# Patient Record
Sex: Male | Born: 1965 | Race: White | Hispanic: No | Marital: Married | Smoking: Former smoker
Health system: Southern US, Community
[De-identification: ages and names within clinical notes are randomized; demographics above are authoritative.]

## PROBLEM LIST (undated history)

## (undated) DIAGNOSIS — T7840XA Allergy, unspecified, initial encounter: Secondary | ICD-10-CM

## (undated) HISTORY — DX: Allergy, unspecified, initial encounter: T78.40XA

---

## 2004-05-24 ENCOUNTER — Encounter: Admission: RE | Admit: 2004-05-24 | Discharge: 2004-05-24 | Payer: Self-pay | Admitting: Otolaryngology

## 2006-02-21 IMAGING — CT CT ORBIT/TEMPORAL/IAC W/O CM
4 of 6 series · 12 of 30 positions shown, 13 images · IV contrast (agent unspecified)
Comparison: None.

CLINICAL DATA: Sensorineural hearing loss.  
PETROUS/TEMPORAL BONE CT WITHOUT CONTRAST:

[Series 2: coronal · axial · 0.33mm/px · z∈[+20,+40]mm · 3 of 62 slices shown, 4 images]
[im 16/62  brain]
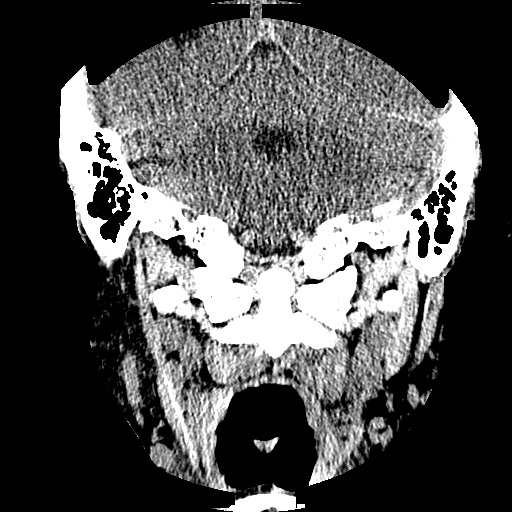
[im 16/62  bone]
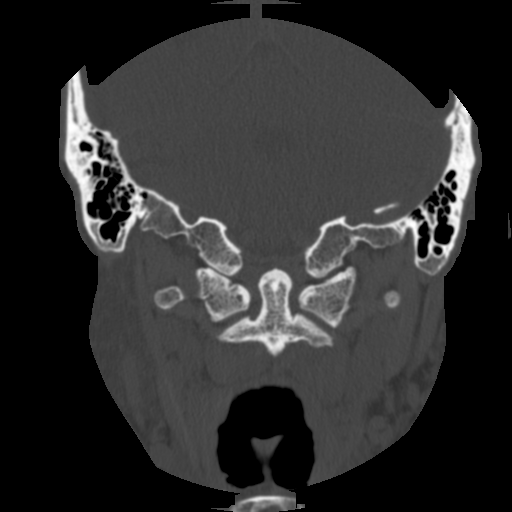
[im 31/62  bone]
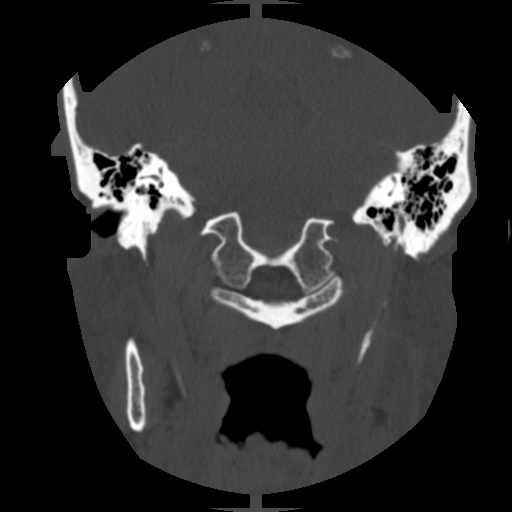
[im 46/62  bone]
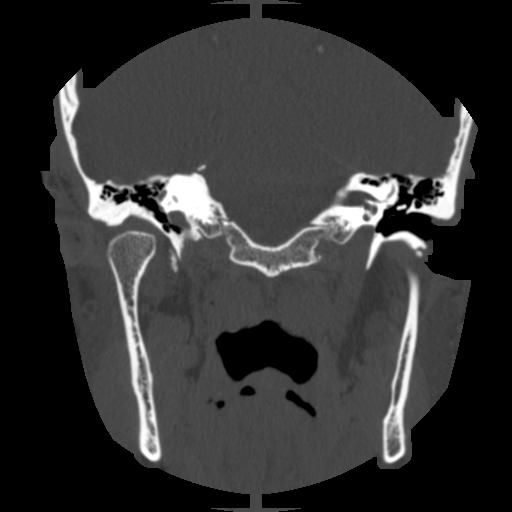

[Series 3: recon 2: coronal · axial · 0.19mm/px · z∈[+6,+26]mm · 3 of 62 slices shown]
[im 16/62  bone]
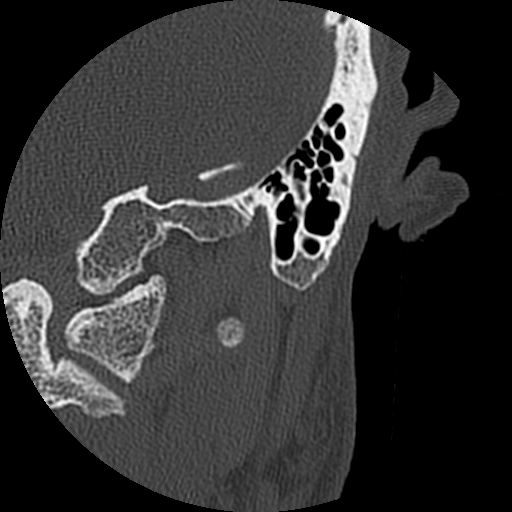
[im 31/62  bone]
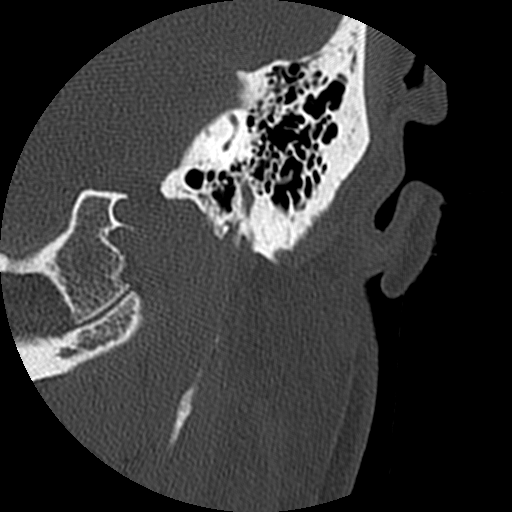
[im 46/62  bone]
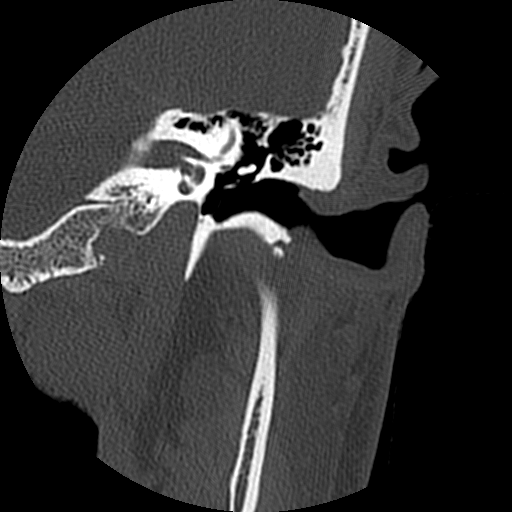

[Series 4: recon 3: coronal · axial · 0.19mm/px · z∈[+6,+26]mm · 3 of 62 slices shown]
[im 16/62  bone]
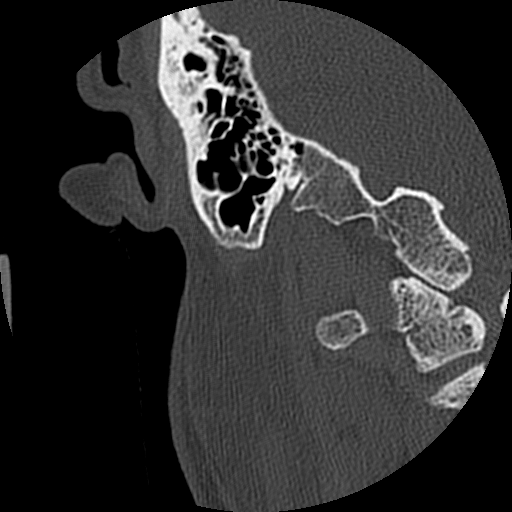
[im 31/62  bone]
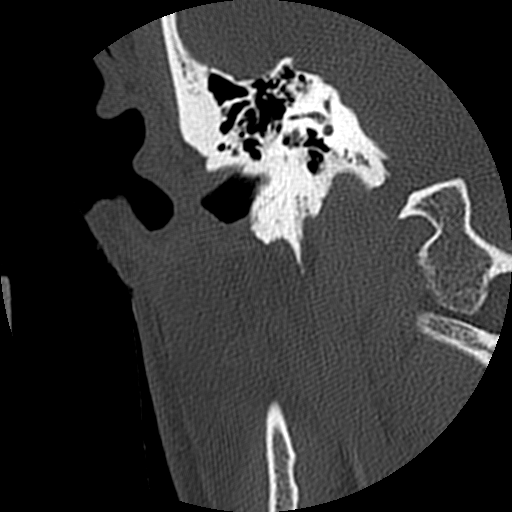
[im 46/62  bone]
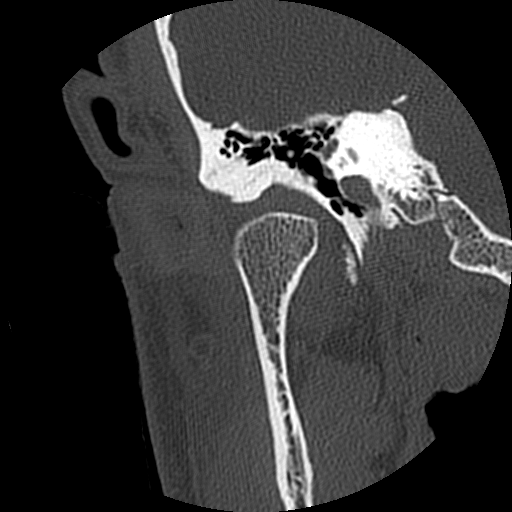

[Series 6: axial · axial · 0.33mm/px · z∈[-5,+14]mm · 3 of 62 slices shown]
[im 16/62  bone]
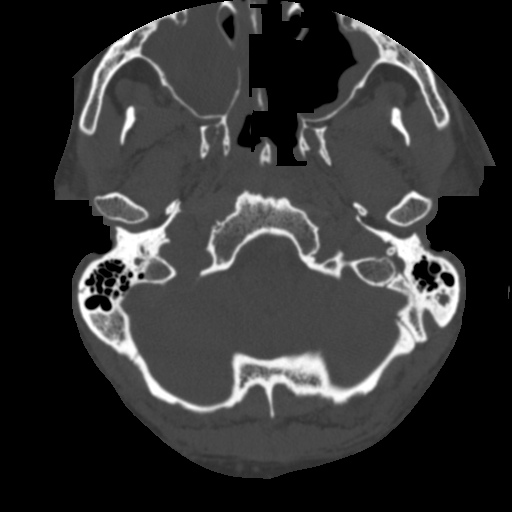
[im 31/62  bone]
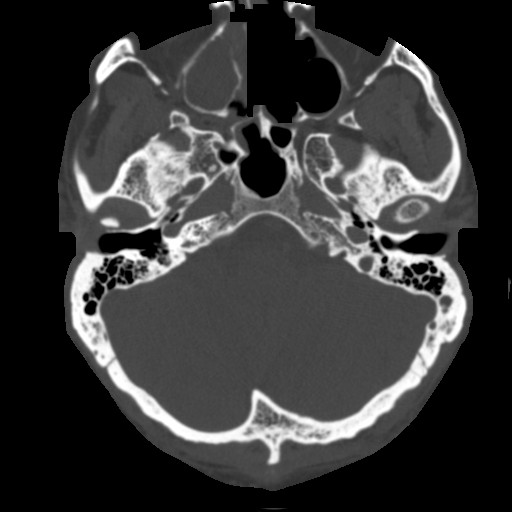
[im 46/62  bone]
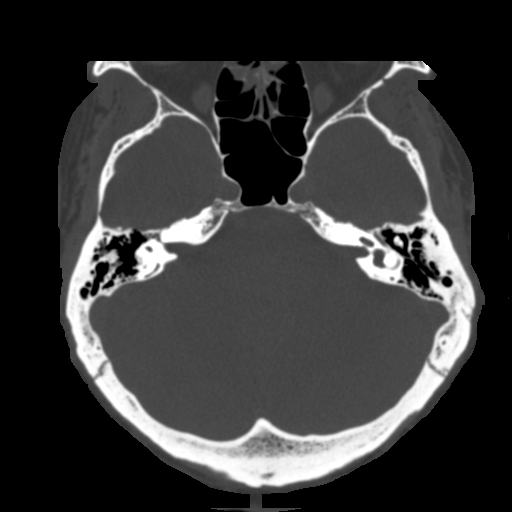

[12 of 30 positions shown; findings below may reference images not displayed]

FINDINGS: Complete opacification right maxillary sinus.  Moderate mucosal thickening left maxillary sinus.  Partial opacification ethmoid sinus air cells bilaterally.  The sphenoid sinus air cells appear clear.  Right mastoid air cells and middle ear are clear.  Left middle ear clear.  Partial opacification of posterior left mastoid air cells.  Some of the septae in the area of opacification may be partially destroyed (coalescence) or poorly delineated because of the thinness of the septum.  There is no evidence to suggest breakthrough into the region of the sigmoid plates nor lateral breakthrough into the adjacent soft tissue. 
The vestibular aqueduct does not appear enlarged.  Cochlea appears formed as do the semicircular canals.  No destruction of the ossicles.  Scutum is intact.   No opacification along Prussak?s space.  Mild dehiscence of the bony cover of the horizontal course of the seventh cranial nerve cannot be excluded given the thinness of the bone at this level (just below the lateral semicircular canal).  For further delineation of retrocochlear abnormality, MR imaging may be considered.  Minimal soft tissue prominence along the superior aspect of the external auditory canal left greater than right may be related to cerumen.
IMPRESSION: 1.  artial opacification posterior left mastoid air cells.  There may be destruction of the thin septum versus poor delineation of the septum Iin this region because of the opacification.  One could further evaluate this on followup imaging.  Please see above discussion. 
2.  A cause for sensorineural hearing loss is not identified on the present exam.  If retrocochlear abnormality is of concern then MR imaging may be considered for further delineation. 
3.  Paranasal sinus disease most notable maxillary sinuses followed by ethmoid sinus air cells.

## 2010-12-29 ENCOUNTER — Ambulatory Visit (INDEPENDENT_AMBULATORY_CARE_PROVIDER_SITE_OTHER): Payer: 59

## 2010-12-29 DIAGNOSIS — H6980 Other specified disorders of Eustachian tube, unspecified ear: Secondary | ICD-10-CM

## 2010-12-29 DIAGNOSIS — J069 Acute upper respiratory infection, unspecified: Secondary | ICD-10-CM

## 2012-01-05 ENCOUNTER — Ambulatory Visit (INDEPENDENT_AMBULATORY_CARE_PROVIDER_SITE_OTHER): Payer: 59 | Admitting: Emergency Medicine

## 2012-01-05 VITALS — BP 135/82 | HR 64 | Temp 97.9°F | Resp 16 | Ht 70.2 in | Wt 179.0 lb

## 2012-01-05 DIAGNOSIS — K920 Hematemesis: Secondary | ICD-10-CM

## 2012-01-05 DIAGNOSIS — R112 Nausea with vomiting, unspecified: Secondary | ICD-10-CM

## 2012-01-05 LAB — POCT CBC
Granulocyte percent: 75 %G (ref 37–80)
HCT, POC: 51.6 % — AB (ref 37.7–47.9)
Lymph, poc: 1.4 (ref 0.6–3.4)
MCH, POC: 28.3 pg (ref 27–31.2)
MCHC: 31 g/dL — AB (ref 31.8–35.4)
MCV: 91.2 fL (ref 80–97)
POC LYMPH PERCENT: 18.5 %L (ref 10–50)
Platelet Count, POC: 288 10*3/uL (ref 142–424)
WBC: 7.8 10*3/uL (ref 4.6–10.2)

## 2012-01-05 MED ORDER — ESOMEPRAZOLE MAGNESIUM 40 MG PO CPDR: 40.0000 mg | DELAYED_RELEASE_CAPSULE | Freq: Every day | ORAL | Status: DC

## 2012-01-05 MED ORDER — ONDANSETRON 8 MG PO TBDP: 8.0000 mg | ORAL_TABLET | Freq: Three times a day (TID) | ORAL | Status: DC | PRN

## 2012-01-05 NOTE — Progress Notes (Signed)
[facility] [address], [place] [address] [phone]  Date:  01/05/2012   Name:  Brian Yu   DOB:  1965-01-30   MRN:  [id number]  PCP:  Tally Due, MD    Chief Complaint: Emesis   History of Present Illness:  Brian Yu is a 46 y.o. very pleasant male patient who presents with the following:  Says that his wife and child were ill with GI virus and that yesterday he had several episodes of vomiting that were blood tinged.  Nauseated prior to vomiting and had nausea today in afternoon.  No food last night or today. No history of PUD or reflux or hiatal hernia.  No hematemesis or melena or hematochezia.  Occasional drinker and regularly drinks caffeinated beverages.  4-5 times a week takes NSAID for headache.    There is no problem list on file for this patient.   History reviewed. No pertinent past medical history.  History reviewed. No pertinent past surgical history.  History  Substance Use Topics  . Smoking status: Current Every Day Smoker  . Smokeless tobacco: Not on file  . Alcohol Use: Yes    History reviewed. No pertinent family history.  No Known Allergies  Medication list has been reviewed and updated.  No current outpatient prescriptions on file prior to visit.    Review of Systems:  As per HPI, otherwise negative.    Physical Examination: Filed Vitals:   01/05/12 1344  BP: 135/82  Pulse: 64  Temp: 97.9 F (36.6 C)  Resp: 16   Filed Vitals:   01/05/12 1344  Height: 5' 10.2" (1.783 m)  Weight: 179 lb (81.194 kg)   Body mass index is 25.54 kg/(m^2). Ideal Body Weight: Weight in (lb) to have BMI = 25: 174.9   GEN: WDWN, NAD, Non-toxic, A & O x 3 not icteric HEENT: Atraumatic, Normocephalic. Neck supple. No masses, No LAD. Ears and Nose: No external deformity. CV: RRR, No M/G/R. No JVD. No thrill. No extra heart sounds. PULM: CTA B, no wheezes, crackles, rhonchi. No retractions. No resp. distress.  No accessory muscle use. ABD: S, NT, ND, +BS. No rebound. No HSM. EXTR: No c/c/e NEURO Normal gait.  PSYCH: Normally interactive. Conversant. Not depressed or anxious appearing.  Calm demeanor.  Rectal:  Grossly negative stool  Assessment and Plan: Hematemesis Nausea and vomiting.   Avoid NSAID/ASA Labs GI consult for EGD  Carmelina Dane, MD  Results for orders placed in visit on 01/05/12  POCT CBC      Component Value Range   WBC 7.8  4.6 - 10.2 K/uL   Lymph, poc 1.4  0.6 - 3.4   POC LYMPH PERCENT 18.5  10 - 50 %L   MID (cbc) 0.5  0 - 0.9   POC MID % 6.5  0 - 12 %M   POC Granulocyte 5.9  2 - 6.9   Granulocyte percent 75.0  37 - 80 %G   RBC 5.66 (*) 4.04 - 5.48 M/uL   Hemoglobin 16.0  12.2 - 16.2 g/dL   HCT, POC 86.5 (*) 78.4 - 47.9 %   MCV 91.2  80 - 97 fL   MCH, POC 28.3  27 - 31.2 pg   MCHC 31.0 (*) 31.8 - 35.4 g/dL   RDW, POC 69.6     Platelet Count, POC 288  142 - 424 K/uL   MPV 8.0  0 - 99.8 fL  IFOBT (OCCULT BLOOD)  Component Value Range   IFOBT Negative

## 2012-01-05 NOTE — Patient Instructions (Addendum)
Hematemesis  This condition is the vomiting of blood.  CAUSES   This can happen if you have a peptic ulcer or an irritation of the throat, stomach, or small bowel. Vomiting over and over again or swallowing blood from a nosebleed, coughing or facial injury can also result in bloody vomit. Anti-inflammatory pain medicines are a common cause of this potentially dangerous condition. The most serious causes of vomiting blood include:   Ulcers (a bacteria called H. pylori is common cause of ulcers).   Clotting problems.   Alcoholism.   Cirrhosis.  TREATMENT   Treatment depends on the cause and the severity of the bleeding. Small amounts of blood streaks in the vomit is not the same as vomiting large amounts of bloody or dark, coffee grounds-like material. Weakness, fainting, dehydration, anemia, and continued alcohol or drug use increase the risk. Examination may include blood, vomit, or stool tests. The presence of bloody or dark stool that tests positive for blood (Hemoccult) means the bleeding has been going on for some time. Endoscopy and imaging studies may be done. Emergency treatment may include:   IV medicines or fluids.   Blood transfusions.   Surgery.  Hospital care is required for high risk patients or when IV fluids or blood is needed. Upper GI bleeding can cause shock and death if not controlled.  HOME CARE INSTRUCTIONS    Your treatment does not require hospital care at this time.   Remain at rest until your condition improves.   Drink clear liquids as tolerated.   Avoid:   Alcohol.   Nicotine.   Aspirin.   Any other anti-inflammatory medicine (ibuprofen, naproxen, and many others).   Medications to suppress stomach acid or vomiting may be needed. Take all your medicine as prescribed.   Be sure to see your caregiver for follow-up as recommended.  SEEK IMMEDIATE MEDICAL CARE IF:    You have repeated vomiting, dehydration, fainting, or extreme weakness.   You are vomiting large amounts of  bloody or dark material.   You pass large, dark or bloody stools.  Document Released: 02/02/2004 Document Revised: 03/19/2011 Document Reviewed: 02/18/2008  [employer] Patient Information 2013 [employer].

## 2012-01-06 NOTE — Progress Notes (Signed)
Reviewed and agree.

## 2012-01-07 ENCOUNTER — Other Ambulatory Visit: Payer: Self-pay | Admitting: *Deleted

## 2012-01-07 LAB — H. PYLORI ANTIBODY, IGG: H Pylori IgG: <0.4 {ISR}

## 2012-01-07 MED ORDER — ESOMEPRAZOLE MAGNESIUM 40 MG PO CPDR: 40.0000 mg | DELAYED_RELEASE_CAPSULE | Freq: Every day | ORAL | Status: DC

## 2012-01-07 NOTE — Addendum Note (Signed)
Addended by: Cydney Ok on: 01/07/2012 12:47 PM   Modules accepted: Orders

## 2012-01-08 ENCOUNTER — Telehealth: Payer: Self-pay

## 2012-01-08 NOTE — Telephone Encounter (Signed)
Pt states that [facility] [address] states that they never received his rx for nexium from [place]. Pt would like for you to contact the pharmacy asap. Best# [phone]

## 2012-01-08 NOTE — Telephone Encounter (Signed)
Was sent in ofr him yesteredy

## 2012-06-03 ENCOUNTER — Ambulatory Visit (INDEPENDENT_AMBULATORY_CARE_PROVIDER_SITE_OTHER): Payer: 59 | Admitting: Internal Medicine

## 2012-06-03 VITALS — BP 110/70 | HR 61 | Temp 98.0°F | Resp 16 | Ht 70.0 in | Wt 175.0 lb

## 2012-06-03 DIAGNOSIS — K1120 Sialoadenitis, unspecified: Secondary | ICD-10-CM

## 2012-06-03 MED ORDER — AMOXICILLIN 500 MG PO CAPS: 1000.0000 mg | ORAL_CAPSULE | Freq: Two times a day (BID) | ORAL | Status: DC

## 2012-06-03 NOTE — Progress Notes (Signed)
  Subjective:    Patient ID: Brian Yu, male    DOB: 03-21-1965, 47 y.o.   MRN: [id number]  HPI 3-4d of swelling of right submandibular gland, is a smoker. No pain but feels tense when swells after eating. No fever. Had recent right ear canal infection. Phx 2 rebuilt tms on the right. Dr Lovey Newcomer his ENT.   Review of Systems     Objective:   Physical Exam  Constitutional: She appears well-developed and well-nourished.  HENT:  Head: Normocephalic.  Right Ear: External ear normal.  Left Ear: External ear normal.  Nose: Nose normal.  Mouth/Throat: No oral lesions. No edematous. No oropharyngeal exudate, posterior oropharyngeal edema or posterior oropharyngeal erythema.    Inflamed at duct, whartons, posiible stone and purulence at opening   Swollen, smooth, fixed mildly tender right submandibular gland       Assessment & Plan:  Quit smoking plan discuseed Amoxil 1g bid See Dr. Lovey Newcomer 2 weeks

## 2012-06-03 NOTE — Patient Instructions (Addendum)
Salivary Stone  Your exam shows you have a stone in one of your saliva glands. These small stones form around a mucous plug in the ducts of the glands and cause the saliva in the gland to be blocked. This makes the gland swollen and painful, especially when you eat. If repeated episodes occur, the gland can become infected. Sometimes these stones can be seen on x-ray.  Treatment includes stimulating the production of saliva to push the stone out. You should suck on a lemon or sour candies several times daily. Antibiotic medicine may be needed if the gland is infected. Increasing fluids, applying warm compresses to the swollen area 3-4 times daily, and massaging the gland from back to front may encourage drainage and passage of the stone.  Surgical treatment to remove the stone is sometimes necessary, so proper medical follow up is very important. Call your doctor for an appointment as recommended. Call right away if you have a high fever, severe headache, vomiting, uncontrolled pain, or other serious symptoms.  Document Released: 02/02/2004 Document Revised: 03/19/2011 Document Reviewed: 12/25/2004  [employer] Patient Information 2014 [employer].

## 2016-01-09 HISTORY — PX: COLONOSCOPY: SHX174

## 2016-01-23 DIAGNOSIS — E559 Vitamin D deficiency, unspecified: Secondary | ICD-10-CM | POA: Diagnosis not present

## 2016-01-23 DIAGNOSIS — Z125 Encounter for screening for malignant neoplasm of prostate: Secondary | ICD-10-CM | POA: Diagnosis not present

## 2016-02-06 DIAGNOSIS — Z0000 Encounter for general adult medical examination without abnormal findings: Secondary | ICD-10-CM | POA: Diagnosis not present

## 2016-02-06 DIAGNOSIS — Z139 Encounter for screening, unspecified: Secondary | ICD-10-CM | POA: Diagnosis not present

## 2016-02-20 DIAGNOSIS — Z23 Encounter for immunization: Secondary | ICD-10-CM | POA: Diagnosis not present

## 2016-04-16 DIAGNOSIS — Z23 Encounter for immunization: Secondary | ICD-10-CM | POA: Diagnosis not present

## 2016-05-03 DIAGNOSIS — M549 Dorsalgia, unspecified: Secondary | ICD-10-CM | POA: Diagnosis not present

## 2016-05-07 DIAGNOSIS — E785 Hyperlipidemia, unspecified: Secondary | ICD-10-CM | POA: Diagnosis not present

## 2016-05-07 DIAGNOSIS — R7989 Other specified abnormal findings of blood chemistry: Secondary | ICD-10-CM | POA: Diagnosis not present

## 2016-05-09 DIAGNOSIS — E785 Hyperlipidemia, unspecified: Secondary | ICD-10-CM | POA: Diagnosis not present

## 2016-05-09 DIAGNOSIS — J309 Allergic rhinitis, unspecified: Secondary | ICD-10-CM | POA: Diagnosis not present

## 2016-05-09 DIAGNOSIS — M549 Dorsalgia, unspecified: Secondary | ICD-10-CM | POA: Diagnosis not present

## 2016-08-13 DIAGNOSIS — Z23 Encounter for immunization: Secondary | ICD-10-CM | POA: Diagnosis not present

## 2017-02-11 DIAGNOSIS — Z0000 Encounter for general adult medical examination without abnormal findings: Secondary | ICD-10-CM | POA: Diagnosis not present

## 2017-02-11 DIAGNOSIS — Z125 Encounter for screening for malignant neoplasm of prostate: Secondary | ICD-10-CM | POA: Diagnosis not present

## 2017-02-11 DIAGNOSIS — E559 Vitamin D deficiency, unspecified: Secondary | ICD-10-CM | POA: Diagnosis not present

## 2017-02-19 DIAGNOSIS — Z1211 Encounter for screening for malignant neoplasm of colon: Secondary | ICD-10-CM | POA: Diagnosis not present

## 2017-02-19 DIAGNOSIS — E785 Hyperlipidemia, unspecified: Secondary | ICD-10-CM | POA: Diagnosis not present

## 2017-02-19 DIAGNOSIS — Z0000 Encounter for general adult medical examination without abnormal findings: Secondary | ICD-10-CM | POA: Diagnosis not present

## 2017-02-19 DIAGNOSIS — R739 Hyperglycemia, unspecified: Secondary | ICD-10-CM | POA: Diagnosis not present

## 2017-02-19 DIAGNOSIS — D696 Thrombocytopenia, unspecified: Secondary | ICD-10-CM | POA: Diagnosis not present

## 2017-08-19 DIAGNOSIS — E785 Hyperlipidemia, unspecified: Secondary | ICD-10-CM | POA: Diagnosis not present

## 2017-08-21 DIAGNOSIS — J309 Allergic rhinitis, unspecified: Secondary | ICD-10-CM | POA: Diagnosis not present

## 2017-08-21 DIAGNOSIS — G43909 Migraine, unspecified, not intractable, without status migrainosus: Secondary | ICD-10-CM | POA: Diagnosis not present

## 2017-08-21 DIAGNOSIS — E782 Mixed hyperlipidemia: Secondary | ICD-10-CM | POA: Diagnosis not present

## 2017-10-07 ENCOUNTER — Ambulatory Visit (INDEPENDENT_AMBULATORY_CARE_PROVIDER_SITE_OTHER): Payer: 59

## 2017-10-07 ENCOUNTER — Ambulatory Visit (INDEPENDENT_AMBULATORY_CARE_PROVIDER_SITE_OTHER): Payer: 59 | Admitting: Orthopaedic Surgery

## 2017-10-07 ENCOUNTER — Encounter (INDEPENDENT_AMBULATORY_CARE_PROVIDER_SITE_OTHER): Payer: Self-pay | Admitting: Orthopaedic Surgery

## 2017-10-07 DIAGNOSIS — M25521 Pain in right elbow: Secondary | ICD-10-CM | POA: Diagnosis not present

## 2017-10-07 MED ORDER — DICLOFENAC SODIUM 1 % TD GEL: 2.0000 g | Freq: Four times a day (QID) | TRANSDERMAL | 3 refills | Status: DC

## 2017-10-07 MED ORDER — METHYLPREDNISOLONE ACETATE 40 MG/ML IJ SUSP
40.0000 mg | INTRAMUSCULAR | Status: AC | PRN
Start: 2017-10-07 — End: 2017-10-07
  Administered 2017-10-07: 40 mg

## 2017-10-07 MED ORDER — LIDOCAINE HCL 1 % IJ SOLN
1.0000 mL | INTRAMUSCULAR | Status: AC | PRN
Start: 2017-10-07 — End: 2017-10-07
  Administered 2017-10-07: 1 mL

## 2017-10-07 NOTE — Progress Notes (Signed)
Office Visit Note   Patient: Brian Yu           Date of Birth: April 09, 1965           MRN: [id number] Visit Date: 10/07/2017              Requested by: No referring provider defined for this encounter. PCP: Lewis Moccasin, MD   Assessment & Plan: Visit Diagnoses:  1. 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  We will try some Voltaren gel if his insurance company will cover this.  Also showed him stretching exercises to try.  He was interested in a steroid injection and I agree with trying this as well The risk minutes of injections.  He tolerated it in the right epicondyl area laterally of his right elbow.  All question concerns were answered and addressed.  Follow-up will be as needed.  He is going to work on avoiding repetitive activities as well.  Follow-Up Instructions: Return if symptoms worsen or fail to improve.   Orders:  Orders Placed This Encounter  Procedures  . XR Elbow 2 Views Right   Meds ordered this encounter  Medications  . diclofenac sodium (VOLTAREN) 1 % GEL    Sig: Apply 2 g topically 4 (four) times daily.    Dispense:  100 g    Refill:  3      Procedures: Hand/UE Inj for lateral epicondylitis on 10/07/2017 1:26 PM Medications: 1 mL lidocaine 1 %; 40 mg methylPREDNISolone acetate 40 MG/ML      Clinical Data: No additional findings.   Subjective: Chief Complaint  Patient presents with  . Right Elbow - Pain  Patient is a very pleasant 52 year old right-hand-dominant male who comes in with elbow pain for few weeks now.  He points to the lateral epicondyle area as a source of his pain.  He denies any significant repetitive activities but he has been sitting with his arm up on the edge of the couch and using a mouse for his computer and that may have led to this.  He denies any shoulder pain he denies any numbness and tingling in his right  hand.  HPI  Review of Systems He currently denies any headache, chest pain, shortness of breath, fever, chills, nausea, vomiting.  Objective: Vital Signs: There were no vitals taken for this visit.  Physical Exam He is alert and oriented x3 and in no acute distress Ortho Exam Examination of his right elbow shows a normal elbow in terms of range of motion and stability on ligamentous exam.  He has no pain over the medial aspect of his elbow is negative Tinel sign over the cubital tunnel.  He has significant pain over the lateral epicondyle area of the elbow.  The remainder of his right upper extremity exam is normal. Specialty Comments:  No specialty comments available.  Imaging: Xr Elbow 2 Views Right  Result Date: 10/07/2017 2 views of the right elbow are unremarkable.  There are no acute findings.  The joint space is well-maintained.    PMFS History: There are no active problems to display for this patient.  Past Medical History:  Diagnosis Date  . Allergy     History reviewed. No pertinent family history.  History reviewed. No pertinent surgical history. Social History   Occupational History  . Not on file  Tobacco Use  .  Smoking status: Current Every Day Smoker  Substance and Sexual Activity  . Alcohol use: Yes  . Drug use: No  . Sexual activity: Not on file

## 2017-10-14 ENCOUNTER — Ambulatory Visit (INDEPENDENT_AMBULATORY_CARE_PROVIDER_SITE_OTHER): Payer: Self-pay | Admitting: Orthopaedic Surgery

## 2017-10-22 DIAGNOSIS — Z13220 Encounter for screening for lipoid disorders: Secondary | ICD-10-CM | POA: Diagnosis not present

## 2017-10-22 DIAGNOSIS — E782 Mixed hyperlipidemia: Secondary | ICD-10-CM | POA: Diagnosis not present

## 2017-10-22 DIAGNOSIS — Z0000 Encounter for general adult medical examination without abnormal findings: Secondary | ICD-10-CM | POA: Diagnosis not present

## 2017-10-25 DIAGNOSIS — E782 Mixed hyperlipidemia: Secondary | ICD-10-CM | POA: Diagnosis not present

## 2017-10-25 DIAGNOSIS — Z23 Encounter for immunization: Secondary | ICD-10-CM | POA: Diagnosis not present

## 2017-10-25 DIAGNOSIS — J309 Allergic rhinitis, unspecified: Secondary | ICD-10-CM | POA: Diagnosis not present

## 2017-10-25 DIAGNOSIS — D696 Thrombocytopenia, unspecified: Secondary | ICD-10-CM | POA: Diagnosis not present

## 2017-10-25 DIAGNOSIS — G43909 Migraine, unspecified, not intractable, without status migrainosus: Secondary | ICD-10-CM | POA: Diagnosis not present

## 2017-11-04 ENCOUNTER — Encounter: Payer: Self-pay | Admitting: Hematology and Oncology

## 2017-11-04 ENCOUNTER — Telehealth: Payer: Self-pay | Admitting: Hematology and Oncology

## 2017-11-04 NOTE — Telephone Encounter (Signed)
New referral received from persistent thrombocytopenia. Pt has been cld and scheduled to see Dr. Caron Presume on 11/14 at 1pm. Pt aware to arrive 30 minutes early. Letter mailed.

## 2017-11-21 ENCOUNTER — Inpatient Hospital Stay: Payer: 59

## 2017-11-21 ENCOUNTER — Inpatient Hospital Stay: Payer: 59 | Attending: Hematology and Oncology | Admitting: Hematology and Oncology

## 2017-11-21 ENCOUNTER — Encounter: Payer: Self-pay | Admitting: Hematology and Oncology

## 2017-11-21 ENCOUNTER — Telehealth: Payer: Self-pay | Admitting: Hematology and Oncology

## 2017-11-21 VITALS — BP 120/83 | HR 77 | Temp 98.2°F | Resp 18 | Ht 70.0 in | Wt 198.7 lb

## 2017-11-21 DIAGNOSIS — F17210 Nicotine dependence, cigarettes, uncomplicated: Secondary | ICD-10-CM | POA: Diagnosis not present

## 2017-11-21 DIAGNOSIS — D696 Thrombocytopenia, unspecified: Secondary | ICD-10-CM

## 2017-11-21 DIAGNOSIS — Z79899 Other long term (current) drug therapy: Secondary | ICD-10-CM | POA: Insufficient documentation

## 2017-11-21 DIAGNOSIS — G43709 Chronic migraine without aura, not intractable, without status migrainosus: Secondary | ICD-10-CM | POA: Diagnosis not present

## 2017-11-21 LAB — CBC WITH DIFFERENTIAL (CANCER CENTER ONLY)
Abs Immature Granulocytes: 0.01 10*3/uL (ref 0.00–0.07)
BASOS ABS: 0.1 10*3/uL (ref 0.0–0.1)
Basophils Relative: 1 %
Eosinophils Absolute: 0.2 10*3/uL (ref 0.0–0.5)
Eosinophils Relative: 3 %
HCT: 44.7 % (ref 39.0–52.0)
Hemoglobin: 14.8 g/dL (ref 13.0–17.0)
IMMATURE GRANULOCYTES: 0 %
LYMPHS PCT: 24 %
Lymphs Abs: 1.9 10*3/uL (ref 0.7–4.0)
MCH: 28.6 pg (ref 26.0–34.0)
MCHC: 33.1 g/dL (ref 30.0–36.0)
MCV: 86.3 fL (ref 80.0–100.0)
Monocytes Absolute: 0.7 10*3/uL (ref 0.1–1.0)
Monocytes Relative: 9 %
NEUTROS PCT: 63 %
NRBC: 0 % (ref 0.0–0.2)
Neutro Abs: 5 10*3/uL (ref 1.7–7.7)
PLATELETS: 118 10*3/uL — AB (ref 150–400)
RBC: 5.18 MIL/uL (ref 4.22–5.81)
RDW: 12.2 % (ref 11.5–15.5)
WBC: 7.8 10*3/uL (ref 4.0–10.5)

## 2017-11-21 LAB — COMPREHENSIVE METABOLIC PANEL
ALBUMIN: 3.9 g/dL (ref 3.5–5.0)
ALT: 24 U/L (ref 0–44)
AST: 18 U/L (ref 15–41)
Alkaline Phosphatase: 78 U/L (ref 38–126)
Anion gap: 9 (ref 5–15)
BUN: 12 mg/dL (ref 6–20)
CO2: 27 mmol/L (ref 22–32)
Calcium: 9.4 mg/dL (ref 8.9–10.3)
Chloride: 107 mmol/L (ref 98–111)
Creatinine, Ser: 1.09 mg/dL (ref 0.61–1.24)
GFR calc Af Amer: >60 mL/min (ref >60)
GFR calc non Af Amer: >60 mL/min (ref >60)
GLUCOSE: 88 mg/dL (ref 70–99)
POTASSIUM: 4.1 mmol/L (ref 3.5–5.1)
SODIUM: 143 mmol/L (ref 135–145)
TOTAL PROTEIN: 7 g/dL (ref 6.5–8.1)
Total Bilirubin: 0.7 mg/dL (ref 0.3–1.2)

## 2017-11-21 LAB — RETIC PANEL
Immature Retic Fract: 3.5 % (ref 2.3–15.9)
RBC.: 5.18 MIL/uL (ref 4.22–5.81)
RETICULOCYTE HEMOGLOBIN: 33.4 pg (ref >27.9)
Retic Count, Absolute: 59.1 10*3/uL (ref 19.0–186.0)
Retic Ct Pct: 1.1 % (ref 0.4–3.1)

## 2017-11-21 LAB — DIC (DISSEMINATED INTRAVASCULAR COAGULATION)PANEL
Fibrinogen: 335 mg/dL (ref 210–475)
Platelets: 118 10*3/uL — ABNORMAL LOW (ref 150–400)
aPTT: 31 seconds (ref 24–36)

## 2017-11-21 LAB — LACTATE DEHYDROGENASE: LDH: 198 U/L — AB (ref 98–192)

## 2017-11-21 LAB — DIC (DISSEMINATED INTRAVASCULAR COAGULATION) PANEL
D DIMER QUANT: 0.47 ug{FEU}/mL (ref 0.00–0.50)
INR: 1.04
PROTHROMBIN TIME: 13.5 s (ref 11.4–15.2)

## 2017-11-21 LAB — VITAMIN B12: VITAMIN B 12: 305 pg/mL (ref 180–914)

## 2017-11-21 LAB — FOLATE: Folate: 11.4 ng/mL (ref >5.9)

## 2017-11-21 NOTE — Patient Instructions (Signed)
We discussed in detail the results of your prior laboratory studies from both February and October.  The most recent platelet count was 111,000 in October.  Laboratory studies were requested today to exclude an underlying nutritional deficiency, autoimmune phenomenon, viral infection, premature destruction of cells, or metabolic anomaly.  Barring any unforeseen complications, your next scheduled doctor visit to discuss these results is on December 11, 2017.  Please do not hesitate to call should any new or untoward problems arise.

## 2017-11-21 NOTE — Progress Notes (Signed)
Outpatient Hematology/Oncology Initial Consultation  Patient Name:  Brian Yu  DOB: 11-Dec-1965   Date of Service: November 21, 2017  Referring Provider: Maryelizabeth Rowan MD [address] [place], [place] [address]   Consulting Physician: Toni Arthurs, MD Hematology/Oncology  Reason for Referral: In the setting of thrombocytopenia identified initially in February, 2019 he presents now for further diagnostic and therapeutic recommendations.  History Present Illness: Brian Yu is a 52 year old resident of [place] his past medical history is significant for dyslipidemia; chronic migraines, twice monthly for the past 8 years; upper gastrointestinal bleeding for years earlier; lateral epicondylitis status post Solu-Medrol injection on September 30; former tobacco dependence; active alcohol dependence.  His primary care physician is Dr. Maryelizabeth Yu.  He is followed also by Dr. Doneen Yu, orthopedic surgery.  He is accompanied by his attentive wife Brian Yu.  Both Brian Yu and Brian Yu were first aware of his low platelet count in February, 2019.  A complete blood count on February 11, 2017 revealed hemoglobin 16.3 hematocrit 47.1 WBC 10.8 with 85% neutrophils 12% lymphocytes 3% monocytes; platelets 34,000.  A comprehensive metabolic panel at that time was normal.  On October 25, 2017: A complete blood count showed hemoglobin 15.4 hematocrit 45.2 MCV 85.6 MCH 29.2 RDW 38.8 (38.2-53) WBC 7.7 with 63% neutrophils 22% lymphocytes 11% monocytes 4% eosinophils; platelets 111,000.  Brian Yu has no diabetes mellitus, primary hypertension, or coronary artery disease.  He has no cardiac dysrhythmia.  He reports no seizure disorder or stroke syndrome.  He denies viral hepatitis, inflammatory bowel disease, or symptomatic diverticulosis.  4 years earlier he had a colonoscopy and polypectomy.  He has no renal or hepatic disease.  He denies rheumatoid or gouty arthritis.  He has mild chronic  lower back muscular pain.  More recently he had a Solu-Medrol injection in the left epicondyle for lateral epicondylitis.  Although he denies heavy alcohol use, during hunting season from September to February, he drinks bourbon every weekend.  He otherwise consumes more moderate drinking during the off season.  He began smoking at the age of 14 years.  He stopped smoking 2 years earlier.  He smoked a maximum 1 pack of cigarettes daily.  Both his appetite and weight are stable. 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 He denies any visual changes or hearing deficit. He has no cough, sore throat, orthopnea.  He denies dyspnea both at rest and on exertion. There is no pain or difficulty in swallowing. No fever, shaking chills, sweats, or flulike symptoms are evident.  He has infrequent dyspepsia. He denies nausea, vomiting, diarrhea, or constipation. He moves his bowels once daily.  Although he had an upper gastrointestinal bleed 4 years earlier, he denies peptic ulcer disease. Those details are not available on the existing chart.  Reports no melena or bright red blood per rectum.  No urinary frequency, urgency, hematuria, dysuria are reported.  There are no new focal areas of bone, joint, muscle pain. There is no numbness or tingling in the fingers or toes. It is with this background he presents now for further diagnostic and therapeutic recommendations in the setting of thrombocytopenia as outlined above.  Past Medical History:  Diagnosis Date  . Allergy   Dyslipidemia Lateral epicondylitis (left) Childhood meningitis Chronic migraine headaches  Surgical History: Tonsillectomy and adenoidectomy in childhood Tympanic membrane surgery 2006  Family History: Mother: Deceased: Age 45 years: COPD Father: Age 43 years: Peripheral neuropathy Brothers (1): Age 19 years:  DM/CAD/MI Sisters (1): Age 6 years: CVA  Social History   Socioeconomic History  . Marital status:  Married    Spouse name: Not on file  . Number of children: Not on file  . Years of education: Not on file  . Highest education level: Not on file  Occupational History  . Not on file  Social Needs  . Financial resource strain: Not on file  . Food insecurity:    Worry: Not on file    Inability: Not on file  . Transportation needs:    Medical: Not on file    Non-medical: Not on file  Tobacco Use  . Smoking status: Current Every Day Smoker  Substance and Sexual Activity  . Alcohol use: Yes  . Drug use: No  . Sexual activity: Not on file  Lifestyle  . Physical activity:    Days per week: Not on file    Minutes per session: Not on file  . Stress: Not on file  Relationships  . Social connections:    Talks on phone: Not on file    Gets together: Not on file    Attends religious service: Not on file    Active member of club or organization: Not on file    Attends meetings of clubs or organizations: Not on file    Relationship status: Not on file  . Intimate partner violence:    Fear of current or ex partner: Not on file    Emotionally abused: Not on file    Physically abused: Not on file    Forced sexual activity: Not on file  Other Topics Concern  . Not on file  Social History Narrative  . Not on file  Brian Yu is married for the past 18 years. 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 He smoked a maximum 1 pack of cigarettes daily He stopped smoking at the age of 32 years He reports no use of pipe, cigars, or chewing tobacco He drinks bourbon on a weekly basis During hunting season he consumes more bourbon on the weekends He denies recreational drug use  Transfusion History: No prior transfusion  Exposure History: No known exposure to toxic chemicals, radiation, or pesticides  Allergies:  No known medical allergies No food allergies He has nonspecific seasonal  allergies  Current Outpatient Medications on File Prior to Visit  Medication Sig  . rosuvastatin (CRESTOR) 5 MG tablet Take 5 mg by mouth daily.  . SUMAtriptan (IMITREX) 100 MG tablet Take 100 mg by mouth 2 (two) times daily as needed.   No current facility-administered medications on file prior to visit.     Review of Systems: Constitutional: No fever, sweats, or shaking chills.  No appetite or weight deficit. Skin: No rash, scaling, sores, lumps, or jaundice. HEENT: No visual changes or hearing deficit. Pulmonary: No unusual cough, sore throat, or orthopnea. Cardiovascular: No coronary artery disease, angina, or myocardial infarction.  No cardiac dysrhythmia, essential hypertension; dyslipidemia. Gastrointestinal: No indigestion, dysphagia, abdominal pain, diarrhea, or constipation.  No change in bowel habits. Upper GI bleed 4 years ago? Etiology.  No nausea or vomiting.  No melena or bright red blood per rectum. Genitourinary: No urinary frequency, urgency, hematuria, or dysuria. Musculoskeletal: No arthralgias or myalgias; left lateral epicondylitis, improved; no joint swelling, pain, or instability. Hematologic: No bleeding tendency or easy bruisability; thrombocytopenia. Endocrine: No intolerance to hot or cold;  no thyroid disease or diabetes mellitus. Vascular: No peripheral arterial or venous thromboembolic disease. Psychological: No anxiety, depression, or mood changes; no mental health illnesses; former tobacco dependence; active alcohol dependence Neurological: Chronic migraine headaches.  No dizziness, lightheadedness, syncope, or near syncopal episodes; no numbness or tingling in the fingers or toes.  Physical Examination: Vital Signs: Body surface area is 2.11 meters squared.  Vitals:   11/21/17 1251  BP: 120/83  Pulse: 77  Resp: 18  Temp: 98.2 F (36.8 C)  SpO2: 99%    Filed Weights   11/21/17 1251  Weight: 198 lb 11.2 oz (90.1 kg)  ECOG PERFORMANCE STATUS:  0 Constitutional:  Brian Yu is fully nourished and developed.  He looks age appropriate.  He is friendly and cooperative without respiratory compromise at rest. Skin: No rashes, scaling, dryness, jaundice, or itching. HEENT: Head is normocephalic and atraumatic.  Pupils are equal round and reactive to light and accommodation.  Sclerae are anicteric.  Conjunctivae are pink.  No sinus tenderness nor oropharyngeal lesions.  Lips without cracking or peeling; tongue without mass, inflammation, or nodularity.  Mucous membranes are moist. Neck: Supple and symmetric.  No jugular venous distention or thyromegaly.  Trachea is midline. Lymphatics: No cervical or supraclavicular lymphadenopathy.  No epitrochlear, axillary, or inguinal lymphadenopathy is appreciated. Respiratory/chest: Thorax is symmetrical.  Breath sounds are clear to auscultation and percussion.  Normal excursion and respiratory effort. Back: Symmetric without deformity or tenderness. Cardiovascular: Heart rate and rhythm are regular without murmurs, gallops, or rubs. Gastrointestinal: Abdomen is soft, nontender; no organomegaly.  Bowel sounds are normoactive.  No masses are appreciated. Genitourinary: Normal external male genitalia. Extremities: In the lower extremities, there is no asymmetric swelling, erythema, tenderness, or cord formation. No clubbing, cyanosis, nor edema. Hematologic: No petechiae, hematomas, or ecchymoses. Psychological:  He is oriented to person, place, and time; normal affect. Neurological: There are no gross neurologic deficits.  Laboratory Results: I have reviewed the data as listed: November 21, 2017  Ref Range & Units 14:44 60yr ago  WBC Count 4.0 - 10.5 K/uL 7.8  7.8 R  RBC 4.22 - 5.81 MIL/uL 5.18  5.66Abnormal  R  Hemoglobin 13.0 - 17.0 g/dL 16.1  09.6 R  HCT 04.5 - 52.0 % 44.7  51.6Abnormal  R  MCV 80.0 - 100.0 fL 86.3  91.2 R  MCH 26.0 - 34.0 pg 28.6  28.3 R  MCHC 30.0 - 36.0 g/dL 40.9  81.1BJYNWGNF   R  RDW 11.5 - 15.5 % 12.2    Platelet Count 150 - 400 K/uL 118Low     nRBC 0.0 - 0.2 % 0.0    Neutrophils Relative % % 63    Neutro Abs 1.7 - 7.7 K/uL 5.0    Lymphocytes Relative % 24    Lymphs Abs 0.7 - 4.0 K/uL 1.9    Monocytes Relative % 9    Monocytes Absolute 0.1 - 1.0 K/uL 0.7    Eosinophils Relative % 3    Eosinophils Absolute 0.0 - 0.5 K/uL 0.2    Basophils Relative % 1    Basophils Absolute 0.0 - 0.1 K/uL 0.1    Immature Granulocytes % 0    Abs Immature Granulocytes 0.00 - 0.07 K/uL 0.01     Ref Range & Units 14:44  Sodium 135 - 145 mmol/L 143   Potassium 3.5 - 5.1 mmol/L 4.1   Chloride 98 - 111 mmol/L 107   CO2 22 - 32 mmol/L 27   Glucose, Bld 70 -  99 mg/dL 88   BUN 6 - 20 mg/dL 12   Creatinine, Ser 9.600.61 - 1.24 mg/dL 4.541.09   Calcium 8.9 - 09.810.3 mg/dL 9.4   Total Protein 6.5 - 8.1 g/dL 7.0   Albumin 3.5 - 5.0 g/dL 3.9   AST 15 - 41 U/L 18   ALT 0 - 44 U/L 24   Alkaline Phosphatase 38 - 126 U/L 78   Total Bilirubin 0.3 - 1.2 mg/dL 0.7   GFR calc non Af Amer >60 mL/min >60   GFR calc Af Amer >60 mL/min >60   LDH 198 Vitamin B12 305 Folic acid 11.4 Reticulocyte count 1.1%  Ref Range & Units 14:44 14:44  Prothrombin Time 11.4 - 15.2 seconds 13.5      INR  1.04      aPTT 24 - 36 seconds 31      Fibrinogen 210 - 475 mg/dL 119335      D-Dimer, Quant 0.00 - 0.50 ug/mL-FEU 0.47    Platelets 118,000 No schistocytes seen on peripheral blood smear  Summary/Assessment: Thrombocytopenia of unclear etiology No evidence of chronic DIC Vitamin B12 and folic acid levels are normal Hepatitis serology: Pending Hemolytic profile: Pending Serum copper: Pending  Other comorbidities: Hypercholesterolemia Chronic migraines Left lateral epicondylitis Upper GI bleed 4 years ago  Recommendation/Plan: 1.  The results of his laboratory studies were not available at the time of discharge.  They will be discussed at the time of his next visit. 2.  The wide fluctuation in  platelet count is difficult to explain. 3.  Immune thrombocytopenia does not tend to go up and down without treatment. 4.  Of concern is the potential toxicity associated with alcohol consumption. 5.  February marked the end of hunting season (PLT 34K) when his alcohol consumption was highest. 6.  A follow-up visit has been scheduled on December 4 7.  Will consider methylmalonic acid and serum homocysteine. 8.  Alcohol is not only toxic to the bone marrow, it also interferes with absorption of minerals, vitamin B12 and folic acid. 9.  He was advised to call us in the interim if any new problems arise.  The total time spent discussing the previous laboratory studies, methodology for evaluating thrombocytopenia, preliminary considerations and recommendations was 60 minutes. At least 50% of that time was spent in discussion, counseling, and answering questions.  All questions were answered to their satisfaction.  This note was dictated using voice activated technology/software.  Unfortunately, typographical errors are not uncommon, and transcription is subject to mistakes and regrettably misinterpretation.  If necessary, clarification of the above information can be discussed with me at any time.  Thank you Dr. Duanne Guessewey for allowing my participation in the care of Brian Yu. I will keep you closely informed as the results of his preliminary laboratory data become available.  Please do not hesitate to call should any questions arise regarding this initial consultation and discussion.  FOLLOW UP: AS DIRECTED   cc:        Brian RowanElizabeth Dewey MD    Toni Arthursichard H Larae Caison, MD  Hematology/Oncology [facility] [address] [place], [place] [address] Office: [phone] Main: [phone]

## 2017-11-21 NOTE — Telephone Encounter (Signed)
Gave pt avs and calendar  °

## 2017-11-22 LAB — HAPTOGLOBIN: HAPTOGLOBIN: 119 mg/dL (ref 34–200)

## 2017-11-22 LAB — HEPATITIS C ANTIBODY: HCV Ab: 0.2 s/co ratio (ref 0.0–0.9)

## 2017-11-22 LAB — HEPATITIS B SURFACE ANTIGEN: Hepatitis B Surface Ag: NEGATIVE

## 2017-11-22 LAB — HEPATITIS B CORE ANTIBODY, TOTAL: HEP B C TOTAL AB: NEGATIVE

## 2017-11-22 LAB — COPPER, SERUM: COPPER: 109 ug/dL (ref 72–166)

## 2017-12-11 ENCOUNTER — Inpatient Hospital Stay: Payer: 59

## 2017-12-11 ENCOUNTER — Inpatient Hospital Stay: Payer: 59 | Attending: Hematology and Oncology | Admitting: Hematology and Oncology

## 2017-12-11 ENCOUNTER — Other Ambulatory Visit: Payer: Self-pay | Admitting: Hematology and Oncology

## 2017-12-11 ENCOUNTER — Encounter: Payer: Self-pay | Admitting: Hematology and Oncology

## 2017-12-11 VITALS — BP 125/82 | HR 64 | Temp 98.4°F | Resp 19 | Ht 70.0 in | Wt 203.3 lb

## 2017-12-11 DIAGNOSIS — D696 Thrombocytopenia, unspecified: Secondary | ICD-10-CM

## 2017-12-11 DIAGNOSIS — Z79899 Other long term (current) drug therapy: Secondary | ICD-10-CM

## 2017-12-11 NOTE — Patient Instructions (Signed)
We reviewed once again the results of your laboratory studies done at the time of your last visit.  Both hemoglobin and white blood cell count were normal.  Your platelet count was 118,000 (150-400,000).  A battery of laboratory studies to exclude an underlying nutritional deficiency, premature destruction of cells, or infectious explanation was not identified.  Today the metabolites of vitamin B12 and folic acid were requested.  Those results are pending.  In the absence of an obvious nutritional deficiency, this is likely associated with an immune-related low platelet count.  Please call for those results in 72 hours unless notified sooner.  Barring any unforeseen complications, your next scheduled doctor visit with laboratory studies is on January 22, 2018.  Please do not hesitate to call should any new or untoward problems arise in the interim.  Wishing you and the family the very best of the holiday season, and the healthiest of new year.  Debbe Odeaichard Ruben, MD Hematology/Oncology

## 2017-12-11 NOTE — Progress Notes (Signed)
Outpatient Progress Note Hematology/Oncology  Patient Name:  Brian Yu  DOB: 1965/08/23   Date of Service: December 4 , 2019  Referring Provider: Maryelizabeth Rowan MD [address] [place], [place] [address]   Consulting Physician: Toni Arthurs, MD Hematology/Oncology  Reason for Referral: In the setting of thrombocytopenia, identified initially in February, 2019 he presents now for the results of his preliminary laboratory evaluation and recommendations.  Brief History: Rudell Ortman is a 52 year old resident of [place] his past medical history is significant for dyslipidemia; chronic migraines, twice monthly for the past 8 years; upper gastrointestinal bleeding for years earlier; lateral epicondylitis status post medrol injection on September 30; former tobacco dependence; active alcohol dependence.  His primary care physician is Dr. Maryelizabeth Rowan.  He is followed also by Dr. Doneen Poisson, orthopedic surgery.  He is alone at this visit.  Both [place] and Dyke Maes, his wife, were first aware of his low platelet count in February, 2019.  A complete blood count on February 11, 2017 revealed hemoglobin 16.3 hematocrit 47.1 WBC 10.8 with 85% neutrophils 12% lymphocytes 3% monocytes; platelets 34,000.  A comprehensive metabolic panel at that time was normal.  On October 25, 2017: A complete blood count showed hemoglobin 15.4 hematocrit 45.2 MCV 85.6 MCH 29.2 RDW 38.8 (38.2-53) WBC 7.7 with 63% neutrophils 22% lymphocytes 11% monocytes 4% eosinophils; platelets 111,000.  He was asymptomatic. He has no history of any blood disorders or bleeding tendency in his immediate family.  He has mild chronic lower back muscular pain.  More recently he had a corticosteroid injection in the left epicondyle for lateral epicondylitis. Although he denies heavy alcohol use, during hunting season from September to February, he drinks bourbon every weekend.  He otherwise consumes more moderate  drinking during the off season.  He began smoking at the age of 14 years.  He stopped smoking 2 years earlier. He smoked a maximum 1 pack of cigarettes daily.  At the time of his initial visit, laboratory studies were obtained to exclude an underlying nutritional deficiency, hemolytic process, metabolic anomaly, or infectious etiology.  Those results are detailed below.  It is with this background he presents now for the results of his preliminary evaluation and recommendations.  Interval History: In the interim since his last visit, he reports no new problems or complaints. He is on no new medications.  Both his appetite and weight are stable. 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 He denies any visual changes or hearing deficit. He has no cough, sore throat, orthopnea.  He denies dyspnea both at rest and on exertion. There is no pain or difficulty in swallowing. No fever, shaking chills, sweats, or flulike symptoms are evident.  He has infrequent dyspepsia. He denies nausea, vomiting, diarrhea, or constipation. Although he had an upper gastrointestinal bleed 4 years earlier, he denies peptic ulcer disease. Those details are not available on the existing chart. He reports no melena or bright red blood per rectum.  No urinary frequency, urgency, hematuria, dysuria are reported.  There are no new focal areas of bone, joint, muscle pain. There is no numbness or tingling in the fingers or toes.   Past Medical History Reviewed        Family History Reviewed        Social History Reviewed  Past Medical History:  Diagnosis Date  . Allergy   Dyslipidemia Lateral epicondylitis (left) Childhood meningitis Chronic migraine headaches  Surgical History: Tonsillectomy and  adenoidectomy in childhood Tympanic membrane surgery 2006  Allergies:  No known medical allergies No food allergies He has nonspecific seasonal allergies  Current Outpatient Medications on File Prior to  Visit  Medication Sig  . diclofenac sodium (VOLTAREN) 1 % GEL Apply 2 g topically 4 (four) times daily as needed.  Marland Kitchen ibuprofen (ADVIL,MOTRIN) 200 MG tablet Take 600 mg by mouth every 6 (six) hours as needed.  . rosuvastatin (CRESTOR) 5 MG tablet Take 5 mg by mouth daily.  . SUMAtriptan (IMITREX) 100 MG tablet Take 100 mg by mouth 2 (two) times daily as needed.   No current facility-administered medications on file prior to visit.     Review of Systems: Constitutional: No fever, sweats, or shaking chills.  No appetite or weight deficit. Skin: No rash, scaling, sores, lumps, or jaundice. HEENT: No visual changes or hearing deficit. Pulmonary: No unusual cough, sore throat, or orthopnea. Cardiovascular: No coronary artery disease, angina, or myocardial infarction.  No cardiac dysrhythmia, essential hypertension; dyslipidemia. Gastrointestinal: No indigestion, dysphagia, abdominal pain, diarrhea, or constipation.  No change in bowel habits. Upper GI bleed 4 years ago? Etiology.  No nausea or vomiting.  No melena or bright red blood per rectum. Genitourinary: No urinary frequency, urgency, hematuria, or dysuria. Musculoskeletal: No arthralgias or myalgias; left lateral epicondylitis, improved; no joint swelling, pain, or instability. Hematologic: No bleeding tendency or easy bruisability; thrombocytopenia. Endocrine: No intolerance to hot or cold; no thyroid disease or diabetes mellitus. Vascular: No peripheral arterial or venous thromboembolic disease. Psychological: No anxiety, depression, or mood changes; no mental health illnesses; former tobacco dependence; active alcohol dependence Neurological: Chronic migraine headaches.  No dizziness, lightheadedness, syncope, or near syncopal episodes; no numbness or tingling in the fingers or toes.  Physical Examination: Vital Signs: Body surface area is 2.13 meters squared.  Vitals:   12/11/17 1549  BP: 125/82  Pulse: 64  Resp: 19  Temp: 98.4 F  (36.9 C)  SpO2: 99%    Filed Weights   12/11/17 1549  Weight: 203 lb 4.8 oz (92.2 kg)  ECOG PERFORMANCE STATUS: 0 Constitutional:  Tahjay Binion is fully nourished and developed.  He looks age appropriate.  He is friendly and cooperative without respiratory compromise at rest. Skin: No rashes, scaling, dryness, jaundice, or itching. HEENT: Head is normocephalic and atraumatic.  Pupils are equal round and reactive to light and accommodation.  Sclerae are anicteric.  Conjunctivae are pink.  No sinus tenderness nor oropharyngeal lesions.  Lips without cracking or peeling; tongue without mass, inflammation, or nodularity.  Mucous membranes are moist. Neck: Supple and symmetric.  No jugular venous distention or thyromegaly.  Trachea is midline. Lymphatics: No cervical or supraclavicular lymphadenopathy.  No epitrochlear, axillary, or inguinal lymphadenopathy is appreciated. Respiratory/chest: Thorax is symmetrical.  Breath sounds are clear to auscultation and percussion.  Normal excursion and respiratory effort. Back: Symmetric without deformity or tenderness. Cardiovascular: Heart rate and rhythm are regular without murmurs, gallops, or rubs. Gastrointestinal: Abdomen is soft, nontender; no organomegaly.  Bowel sounds are normoactive.  No masses are appreciated. Extremities: In the lower extremities, there is no asymmetric swelling, erythema, tenderness, or cord formation. No clubbing, cyanosis, nor edema. Hematologic: No petechiae, hematomas, or ecchymoses. Psychological:  He is oriented to person, place, and time; normal affect. Neurological: There are no gross neurologic deficits.  Laboratory Results: I have reviewed the data as listed: November 21, 2017  Ref Range & Units 14:44 58yr ago  WBC Count 4.0 - 10.5 K/uL 7.8  7.8  R  RBC 4.22 - 5.81 MIL/uL 5.18  5.66Abnormal  R  Hemoglobin 13.0 - 17.0 g/dL 91.4  78.2 R  HCT 95.6 - 52.0 % 44.7  51.6Abnormal  R  MCV 80.0 - 100.0 fL 86.3  91.2 R  MCH  26.0 - 34.0 pg 28.6  28.3 R  MCHC 30.0 - 36.0 g/dL 21.3  08.6VHQIONGE  R  RDW 11.5 - 15.5 % 12.2    Platelet Count 150 - 400 K/uL 118Low     nRBC 0.0 - 0.2 % 0.0    Neutrophils Relative % % 63    Neutro Abs 1.7 - 7.7 K/uL 5.0    Lymphocytes Relative % 24    Lymphs Abs 0.7 - 4.0 K/uL 1.9    Monocytes Relative % 9    Monocytes Absolute 0.1 - 1.0 K/uL 0.7    Eosinophils Relative % 3    Eosinophils Absolute 0.0 - 0.5 K/uL 0.2    Basophils Relative % 1    Basophils Absolute 0.0 - 0.1 K/uL 0.1    Immature Granulocytes % 0    Abs Immature Granulocytes 0.00 - 0.07 K/uL 0.01     Ref Range & Units 14:44  Sodium 135 - 145 mmol/L 143   Potassium 3.5 - 5.1 mmol/L 4.1   Chloride 98 - 111 mmol/L 107   CO2 22 - 32 mmol/L 27   Glucose, Bld 70 - 99 mg/dL 88   BUN 6 - 20 mg/dL 12   Creatinine, Ser 9.52 - 1.24 mg/dL 8.41   Calcium 8.9 - 32.4 mg/dL 9.4   Total Protein 6.5 - 8.1 g/dL 7.0   Albumin 3.5 - 5.0 g/dL 3.9   AST 15 - 41 U/L 18   ALT 0 - 44 U/L 24   Alkaline Phosphatase 38 - 126 U/L 78   Total Bilirubin 0.3 - 1.2 mg/dL 0.7   GFR calc non Af Amer >60 mL/min >60   GFR calc Af Amer >60 mL/min >60   LDH 198 Vitamin B12 305 Folic acid 11.4 Reticulocyte count 1.1% Haptoglobin 119 Hepatitis C antibody: 0.2 Hepatitis B core total antibody: Negative Hepatitis B surface antigen: Negative Copper 109 DIC panel  Ref Range & Units 14:44 14:44  Prothrombin Time 11.4 - 15.2 seconds 13.5      INR  1.04      aPTT 24 - 36 seconds 31      Fibrinogen 210 - 475 mg/dL 401      D-Dimer, Quant 0.00 - 0.50 ug/mL-FEU 0.47    Platelets 118,000 No schistocytes seen on peripheral blood smear  Summary/Assessment: Thrombocytopenia of unclear etiology No evidence of chronic DIC Vitamin B12 and folic acid levels are normal Hepatitis serology: Negative Hemolytic profile: Normal Serum copper: Normal DIC panel: Normal  Other comorbidities: Hypercholesterolemia Chronic migraines Left lateral  epicondylitis Upper GI bleed 4 years ago  Recommendation/Plan: 1.  The results of his laboratory studies from November 14 were reviewed and discussed in detail.        Those results are detailed above. 2.  The wide fluctuation in platelet count is difficult to explain. 3.   Immune thrombocytopenia does not tend to go up and down without treatment. 4.   Of concern is the potential toxicity associated with alcohol consumption. 5.    February marked the end of hunting season (PLT 34K) when his alcohol consumption was highest. 6.   Although vitamin B12 and folic acid were normal, the metabolites homocysteine and methylmalonic acid  were requested.Those  results from today are pending. 7.   Ethelene Brownsnthony was requested to call us for the results within 72 hours unless notified sooner 8.   A follow-up visit with repeat laboratory studies has been scheduled on January 15 9.   A complete blood count will be repeated. 10. Alcohol is not only toxic to the bone marrow, it also interferes with absorption of minerals, vitamin B12 and folic acid. 11. 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 This is a diagnosis of exclusion. 12. He was advised to call us in the interim if any new problems arise.   This note was dictated using voice activated technology/software.  Unfortunately, typographical errors are not uncommon, and transcription is subject to mistakes and regrettably misinterpretation.  If necessary, clarification of the above information can be discussed with me at any time.  FOLLOW UP: AS DIRECTED   cc:        Maryelizabeth RowanElizabeth Dewey MD   Toni Arthursichard H Meyli Boice, MD  Hematology/Oncology [facility] [address] [place], [place] [address] Office: [phone] Main: [phone]

## 2017-12-12 LAB — HOMOCYSTEINE: Homocysteine: 13.4 umol/L (ref 0.0–15.0)

## 2017-12-13 LAB — METHYLMALONIC ACID, SERUM: Methylmalonic Acid, Quantitative: 234 nmol/L (ref 0–378)

## 2017-12-16 ENCOUNTER — Telehealth: Payer: Self-pay | Admitting: *Deleted

## 2017-12-16 NOTE — Telephone Encounter (Signed)
Patient called regarding lab results from 12/4. Per Caron Presumeuben, MD lab results test for folic acid, and vitamin B-12 metabolites deficiency were both normal. Patient verbalized understanding..Marland Kitchen

## 2017-12-30 ENCOUNTER — Telehealth: Payer: Self-pay | Admitting: Hematology and Oncology

## 2017-12-30 NOTE — Telephone Encounter (Signed)
Spoke with patient about scheduled appt for Jan 15.  Per patient request printed and mailed calendar.

## 2018-01-20 ENCOUNTER — Other Ambulatory Visit: Payer: Self-pay | Admitting: Hematology

## 2018-01-20 DIAGNOSIS — D696 Thrombocytopenia, unspecified: Secondary | ICD-10-CM

## 2018-01-20 NOTE — Progress Notes (Addendum)
[place] OFFICE PROGRESS NOTE  Patient Care Team: Fanny Bien, MD as PCP - General (Family Medicine)  HEME/ONC OVERVIEW: 1. Thrombocytopenia -Previous patient of Dr. Audelia Hives -Plts noted to be 34k in 02/2017; improved spontaneously to 118k in 11/2017 -Viral panel (Hep B/C, HIV) negative; coags WNL; B12 borderline low; folate normal   PERTINENT NON-HEM/ONC PROBLEMS: 1. Hx of intermittent moderate EtOH use   ASSESSMENT & PLAN:  Thrombocytopenia  -Previous patient of Dr. Audelia Hives -Patient was noted with incidental thrombocytopenia (plts 34k) in 02/2017; it spontaneously improved to 118k in 11/2017 -Viral and nutritional studies unremarkable -Plt count 102k today, stable; patient denies any symptoms of abnormal bleeding or bruising -I reviewed the peripheral blood smear, which showed intermittent immature platelets with occasional large platelets -Manual platelet count 110k  -Given the patient's hx of intermittent moderate EtOH use, this could be due to EtOH use -Alternatively, this may suggest ITP in the setting of self-improving platelet count -I have ordered abdominal US to assess for any liver abnormalities and splenomegaly -If the abdominal US does not show any liver or spleen abnormality, then I would not recommend any further work-up of thrombocytopenia at this time, such as bone marrow biopsy -I counseled patient on any concerning symptoms, such as abnormal bleeding or bruising, including persistent epistaxis, hemoptysis, hematemesis, hematochezia, melena, or hematuria, for which he should seek care promptly  Transaminitis -ALT 61 today, new -I have ordered abdominal ultrasound to assess for any liver abnormality  Intermittent moderate EtOH use -I counseled the patient on the importance of EtOH cessation, especially given its possible impact on his bone marrow and liver  Orders Placed This Encounter  Procedures  . US Abdomen Complete    Standing Status:    Future    Standing Expiration Date:   01/22/2019    Order Specific Question:   Reason for Exam (SYMPTOM  OR DIAGNOSIS REQUIRED)    Answer:   Thrombocytopenia, rule out liver disease    Order Specific Question:   Preferred imaging location?    Answer:   [facility]  . CBC with Differential (Cancer Center Only)    Standing Status:   Future    Standing Expiration Date:   02/26/2019  . CMP ([facility] only)    Standing Status:   Future    Standing Expiration Date:   02/26/2019  . Lactate dehydrogenase    Standing Status:   Future    Standing Expiration Date:   02/26/2019   All questions were answered. The patient knows to call the clinic with any problems, questions or concerns. No barriers to learning was detected.  A total of more than 25 minutes were spent face-to-face with the patient during this encounter and over half of that time was spent on counseling and coordination of care as outlined above.   Return in 2 months for labs and clinic follow-up.   Brian Men, MD 01/22/2018 3:36 PM  CHIEF COMPLAINT: "I am doing well"  INTERVAL HISTORY: [name] returns to clinic for follow-up of number cytopenia.  He reports that since last visit, he has been doing well, and he denies any abnormal bleeding or bruising, such as epistaxis, hematemesis, hemoptysis, hematuria, hematochezia, or melena.  He is up-to-date with colonoscopy.  He denies any constitutional symptoms.  He is working full-time without any restrictions.  REVIEW OF SYSTEMS:   Constitutional: ( - ) fevers, ( - )  chills , ( - ) night sweats Eyes: ( - )  blurriness of vision, ( - ) double vision, ( - ) watery eyes Ears, nose, mouth, throat, and face: ( - ) mucositis, ( - ) sore throat Respiratory: ( - ) cough, ( - ) dyspnea, ( - ) wheezes Cardiovascular: ( - ) palpitation, ( - ) chest discomfort, ( - ) lower extremity swelling Gastrointestinal:  ( - ) nausea, ( - ) heartburn, ( - ) change in bowel habits Skin: ( - )  abnormal skin rashes Lymphatics: ( - ) new lymphadenopathy, ( - ) easy bruising Neurological: ( - ) numbness, ( - ) tingling, ( - ) new weaknesses Behavioral/Psych: ( - ) mood change, ( - ) new changes  All other systems were reviewed with the patient and are negative.  I have reviewed the past medical history, past surgical history, social history and family history with the patient and they are unchanged from previous note.  ALLERGIES:  has No Known Allergies.  MEDICATIONS:  Current Outpatient Medications  Medication Sig Dispense Refill  . SUMAtriptan (IMITREX) 100 MG tablet Take 100 mg by mouth 2 (two) times daily as needed.    . diclofenac sodium (VOLTAREN) 1 % GEL Apply 2 g topically 4 (four) times daily as needed.    Marland Kitchen ibuprofen (ADVIL,MOTRIN) 200 MG tablet Take 600 mg by mouth every 6 (six) hours as needed.    . rosuvastatin (CRESTOR) 5 MG tablet Take 5 mg by mouth daily.     No current facility-administered medications for this visit.     PHYSICAL EXAMINATION: ECOG PERFORMANCE STATUS: 0 - Asymptomatic  Today's Vitals   01/22/18 1455 01/22/18 1457  BP: 119/71   Pulse: 71   Resp: 18   Temp: 98.6 F (37 C)   TempSrc: Oral   SpO2: 98%   Height: '5\' 10"'  (1.778 m)   PainSc:  0-No pain   Body mass index is 29.17 kg/m.  There were no vitals filed for this visit.  GENERAL: alert, no distress and comfortable SKIN: skin color, texture, turgor are normal, no rashes or significant lesions EYES: conjunctiva are pink and non-injected, sclera clear OROPHARYNX: no exudate, no erythema; lips, buccal mucosa, and tongue normal  NECK: supple, non-tender LYMPH:  no palpable lymphadenopathy in the cervical or axillary  LUNGS: clear to auscultation and percussion with normal breathing effort HEART: regular rate & rhythm and no murmurs and no lower extremity edema ABDOMEN: soft, non-tender, non-distended, normal bowel sounds Musculoskeletal: no cyanosis of digits and no clubbing   PSYCH: alert & oriented x 3, fluent speech NEURO: no focal motor/sensory deficits  LABORATORY DATA:  I have reviewed the data as listed    Component Value Date/Time   NA 139 01/22/2018 1432   K 3.9 01/22/2018 1432   CL 107 01/22/2018 1432   CO2 25 01/22/2018 1432   GLUCOSE 114 (H) 01/22/2018 1432   BUN 20 01/22/2018 1432   CREATININE 1.20 01/22/2018 1432   CALCIUM 9.1 01/22/2018 1432   PROT 7.0 01/22/2018 1432   ALBUMIN 3.8 01/22/2018 1432   AST 29 01/22/2018 1432   ALT 61 (H) 01/22/2018 1432   ALKPHOS 81 01/22/2018 1432   BILITOT 0.5 01/22/2018 1432   GFRNONAA >60 01/22/2018 1432   GFRAA >60 01/22/2018 1432    No results found for: SPEP, UPEP  Lab Results  Component Value Date   WBC 8.0 01/22/2018   NEUTROABS 4.9 01/22/2018   HGB 15.3 01/22/2018   HCT 44.5 01/22/2018   MCV 84.1 01/22/2018   PLT  102 (L) 01/22/2018      Chemistry      Component Value Date/Time   NA 139 01/22/2018 1432   K 3.9 01/22/2018 1432   CL 107 01/22/2018 1432   CO2 25 01/22/2018 1432   BUN 20 01/22/2018 1432   CREATININE 1.20 01/22/2018 1432      Component Value Date/Time   CALCIUM 9.1 01/22/2018 1432   ALKPHOS 81 01/22/2018 1432   AST 29 01/22/2018 1432   ALT 61 (H) 01/22/2018 1432   BILITOT 0.5 01/22/2018 1432       RADIOGRAPHIC STUDIES: I have personally reviewed the radiological images as listed below and agreed with the findings in the report. No results found.

## 2018-01-22 ENCOUNTER — Inpatient Hospital Stay: Payer: 59

## 2018-01-22 ENCOUNTER — Inpatient Hospital Stay: Payer: 59 | Attending: Hematology and Oncology | Admitting: Hematology

## 2018-01-22 ENCOUNTER — Telehealth: Payer: Self-pay | Admitting: *Deleted

## 2018-01-22 ENCOUNTER — Telehealth: Payer: Self-pay | Admitting: Hematology

## 2018-01-22 ENCOUNTER — Encounter: Payer: Self-pay | Admitting: Hematology

## 2018-01-22 ENCOUNTER — Ambulatory Visit: Payer: 59 | Admitting: Hematology

## 2018-01-22 VITALS — BP 119/71 | HR 71 | Temp 98.6°F | Resp 18 | Ht 70.0 in

## 2018-01-22 DIAGNOSIS — Z789 Other specified health status: Secondary | ICD-10-CM

## 2018-01-22 DIAGNOSIS — F1099 Alcohol use, unspecified with unspecified alcohol-induced disorder: Secondary | ICD-10-CM | POA: Diagnosis not present

## 2018-01-22 DIAGNOSIS — D696 Thrombocytopenia, unspecified: Secondary | ICD-10-CM | POA: Diagnosis not present

## 2018-01-22 DIAGNOSIS — Z79899 Other long term (current) drug therapy: Secondary | ICD-10-CM | POA: Diagnosis not present

## 2018-01-22 DIAGNOSIS — R740 Nonspecific elevation of levels of transaminase and lactic acid dehydrogenase [LDH]: Secondary | ICD-10-CM | POA: Insufficient documentation

## 2018-01-22 DIAGNOSIS — R7401 Elevation of levels of liver transaminase levels: Secondary | ICD-10-CM | POA: Insufficient documentation

## 2018-01-22 DIAGNOSIS — Z7289 Other problems related to lifestyle: Secondary | ICD-10-CM

## 2018-01-22 LAB — CMP (CANCER CENTER ONLY)
ALT: 61 U/L — ABNORMAL HIGH (ref 0–44)
AST: 29 U/L (ref 15–41)
Albumin: 3.8 g/dL (ref 3.5–5.0)
Alkaline Phosphatase: 81 U/L (ref 38–126)
Anion gap: 7 (ref 5–15)
BUN: 20 mg/dL (ref 6–20)
CO2: 25 mmol/L (ref 22–32)
Calcium: 9.1 mg/dL (ref 8.9–10.3)
Chloride: 107 mmol/L (ref 98–111)
Creatinine: 1.2 mg/dL (ref 0.61–1.24)
GFR, Est AFR Am: >60 mL/min (ref >60)
GFR, Estimated: >60 mL/min (ref >60)
Glucose, Bld: 114 mg/dL — ABNORMAL HIGH (ref 70–99)
Potassium: 3.9 mmol/L (ref 3.5–5.1)
Sodium: 139 mmol/L (ref 135–145)
Total Bilirubin: 0.5 mg/dL (ref 0.3–1.2)
Total Protein: 7 g/dL (ref 6.5–8.1)

## 2018-01-22 LAB — CBC WITH DIFFERENTIAL (CANCER CENTER ONLY)
Abs Immature Granulocytes: 0.03 10*3/uL (ref 0.00–0.07)
Basophils Absolute: 0.1 10*3/uL (ref 0.0–0.1)
Basophils Relative: 1 %
Eosinophils Absolute: 0.3 10*3/uL (ref 0.0–0.5)
Eosinophils Relative: 4 %
HCT: 44.5 % (ref 39.0–52.0)
Hemoglobin: 15.3 g/dL (ref 13.0–17.0)
Immature Granulocytes: 0 %
Lymphocytes Relative: 25 %
Lymphs Abs: 2 10*3/uL (ref 0.7–4.0)
MCH: 28.9 pg (ref 26.0–34.0)
MCHC: 34.4 g/dL (ref 30.0–36.0)
MCV: 84.1 fL (ref 80.0–100.0)
Monocytes Absolute: 0.7 10*3/uL (ref 0.1–1.0)
Monocytes Relative: 9 %
Neutro Abs: 4.9 10*3/uL (ref 1.7–7.7)
Neutrophils Relative %: 61 %
PLATELETS: 102 10*3/uL — AB (ref 150–400)
RBC: 5.29 MIL/uL (ref 4.22–5.81)
RDW: 12.4 % (ref 11.5–15.5)
WBC Count: 8 10*3/uL (ref 4.0–10.5)
nRBC: 0 % (ref 0.0–0.2)

## 2018-01-22 LAB — SAVE SMEAR(SSMR), FOR PROVIDER SLIDE REVIEW

## 2018-01-22 LAB — LACTATE DEHYDROGENASE: LDH: 177 U/L (ref 98–192)

## 2018-01-22 NOTE — Telephone Encounter (Signed)
-----   Message from Arthur Holms, MD sent at 01/22/2018  3:43 PM EST ----- Can you let [name] know that his kidney function is fine, but one of his liver enzyme is just slightly elevated. We will obtain abdominal US as discussed in clinic, and he should avoid EtOH use.   Thank you.  GZ ----- Message ----- From: Leory Plowman, Lab In [place] Sent: 01/22/2018   2:51 PM EST To: Arthur Holms, MD

## 2018-01-22 NOTE — Telephone Encounter (Signed)
Printed calendar and avs. °

## 2018-01-22 NOTE — Telephone Encounter (Signed)
[facility] F/U appointment sheduled

## 2018-01-22 NOTE — Telephone Encounter (Signed)
Spoke with pt and informed pt of lab results as per Dr. Kem Kays instructions.  Pt understood that radiology scheduler will contact pt with appt date and time for US abdomen as discussed by MD at office visit today.  Advised pt to avoid ETOH use as per MD.  Pt voiced understanding.

## 2018-01-28 ENCOUNTER — Telehealth: Payer: Self-pay | Admitting: *Deleted

## 2018-01-28 ENCOUNTER — Ambulatory Visit (HOSPITAL_COMMUNITY)
Admission: RE | Admit: 2018-01-28 | Discharge: 2018-01-28 | Disposition: A | Discharge status: Home / Self Care | Payer: 59 | Source: Ambulatory Visit | Attending: Hematology | Admitting: Hematology

## 2018-01-28 DIAGNOSIS — K760 Fatty (change of) liver, not elsewhere classified: Secondary | ICD-10-CM | POA: Diagnosis not present

## 2018-01-28 DIAGNOSIS — D696 Thrombocytopenia, unspecified: Secondary | ICD-10-CM | POA: Diagnosis not present

## 2018-01-28 NOTE — Telephone Encounter (Signed)
Spoke with pt and informed pt of US Abdomen results as per Dr. Kem Kays instructions.  Encouraged pt to increase exercise and optimize diet in order to lose weight as per Dr. Kem Kays recommendations.   Pt voiced understanding, and aware of follow up appt in March.

## 2018-01-28 NOTE — Telephone Encounter (Signed)
-----   Message from Arthur Holms, MD sent at 01/28/2018  2:04 PM EST ----- Can you let [name] know that his ultrasound showed fatty liver infiltration? There was no other significant abnormality. I recommend him to increase exercise and optimize his diet in order to lose weight. Otherwise, no change in management. I will see him in 2 months.  Thanks.  GZ ----- Message ----- From: Interface, Rad Results In Sent: 01/28/2018   1:25 PM EST To: Arthur Holms, MD

## 2018-02-20 DIAGNOSIS — Z0000 Encounter for general adult medical examination without abnormal findings: Secondary | ICD-10-CM | POA: Diagnosis not present

## 2018-02-20 DIAGNOSIS — Z125 Encounter for screening for malignant neoplasm of prostate: Secondary | ICD-10-CM | POA: Diagnosis not present

## 2018-02-24 DIAGNOSIS — Z0000 Encounter for general adult medical examination without abnormal findings: Secondary | ICD-10-CM | POA: Diagnosis not present

## 2018-02-24 DIAGNOSIS — Z6828 Body mass index (BMI) 28.0-28.9, adult: Secondary | ICD-10-CM | POA: Diagnosis not present

## 2018-03-24 ENCOUNTER — Telehealth: Payer: Self-pay | Admitting: Hematology

## 2018-03-24 NOTE — Telephone Encounter (Signed)
Called and spoke with patient regarding r/s his appointment from 3/19 to 3/20 per 3/16 staff message

## 2018-03-25 ENCOUNTER — Ambulatory Visit: Payer: 59 | Admitting: Hematology

## 2018-03-25 ENCOUNTER — Other Ambulatory Visit: Payer: 59

## 2018-03-27 ENCOUNTER — Other Ambulatory Visit: Payer: 59

## 2018-03-27 ENCOUNTER — Ambulatory Visit: Payer: 59 | Admitting: Hematology

## 2018-03-28 ENCOUNTER — Inpatient Hospital Stay: Payer: 59

## 2018-03-28 ENCOUNTER — Inpatient Hospital Stay: Payer: 59 | Admitting: Hematology

## 2018-04-27 ENCOUNTER — Other Ambulatory Visit: Payer: Self-pay | Admitting: Hematology

## 2018-04-27 DIAGNOSIS — D696 Thrombocytopenia, unspecified: Secondary | ICD-10-CM

## 2018-04-28 DIAGNOSIS — G43909 Migraine, unspecified, not intractable, without status migrainosus: Secondary | ICD-10-CM | POA: Diagnosis not present

## 2018-04-28 DIAGNOSIS — E782 Mixed hyperlipidemia: Secondary | ICD-10-CM | POA: Diagnosis not present

## 2018-04-28 DIAGNOSIS — J309 Allergic rhinitis, unspecified: Secondary | ICD-10-CM | POA: Diagnosis not present

## 2018-04-29 ENCOUNTER — Other Ambulatory Visit: Payer: 59

## 2018-04-29 ENCOUNTER — Ambulatory Visit: Payer: 59 | Admitting: Hematology

## 2019-08-28 ENCOUNTER — Encounter: Payer: 59 | Admitting: Family Medicine

## 2019-09-07 ENCOUNTER — Other Ambulatory Visit: Payer: Self-pay

## 2019-09-07 ENCOUNTER — Ambulatory Visit: Payer: 59 | Admitting: Family Medicine

## 2019-09-07 ENCOUNTER — Encounter: Payer: Self-pay | Admitting: Family Medicine

## 2019-09-07 VITALS — BP 124/78 | HR 85 | Ht 68.5 in | Wt 207.4 lb

## 2019-09-07 DIAGNOSIS — Z6831 Body mass index (BMI) 31.0-31.9, adult: Secondary | ICD-10-CM

## 2019-09-07 DIAGNOSIS — E785 Hyperlipidemia, unspecified: Secondary | ICD-10-CM

## 2019-09-07 DIAGNOSIS — H6192 Disorder of left external ear, unspecified: Secondary | ICD-10-CM

## 2019-09-07 DIAGNOSIS — E6609 Other obesity due to excess calories: Secondary | ICD-10-CM

## 2019-09-07 DIAGNOSIS — Z0000 Encounter for general adult medical examination without abnormal findings: Secondary | ICD-10-CM | POA: Diagnosis not present

## 2019-09-07 DIAGNOSIS — D696 Thrombocytopenia, unspecified: Secondary | ICD-10-CM | POA: Diagnosis not present

## 2019-09-07 DIAGNOSIS — L609 Nail disorder, unspecified: Secondary | ICD-10-CM

## 2019-09-07 NOTE — Patient Instructions (Signed)
   For health maintenance for illness prevention:  - Gargle mouthwash 2 x daily - gargle (do not swallow) antiseptic mouthwash with cetylpyridinium chloride (e.g. ScopeTM, ActTM, CrestTM) or ListerineTM with essential oils.  - Vitamin D3 1,000-3,000 IU/day  - Vitamin C 500-1,000?mg 2 x daily  - Quercetin 250?mg/day  - Zinc 30-40?mg/day (elemental zinc)  - Melatonin 6?mg before bedtime (causes drowsiness)    For toenails:  - Vick's Vaporub applied on top and underneath nails twice daily for 3 months.

## 2019-09-07 NOTE — Progress Notes (Signed)
Office Visit Note   Patient: Brian Yu           Date of Birth: 1965/05/26           MRN: [id number] Visit Date: 09/07/2019 Requested by: Lewis Moccasin, MD [address] [place],  [place] [address] PCP: Lavada Mesi, MD  Subjective: Chief Complaint  Patient presents with  . establish primary care  . wellness exam    HPI: He is here for a wellness exam.  His wife is a patient of our clinic.  Overall he is feeling well.  1 concern is that he is not comfortable getting a Covid vaccine.  He has had other vaccines, but for this 1 he is not yet convinced that he wants to get it.  He wonders what he can do for illness prevention.  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  It has been present for about a year and does not seem to be changing, it does not bother him.  He is a former smoker, he quit 5 years ago.  He had borderline elevated lipids at his last wellness exam and was given Crestor, but he does not like to take medicines on a daily basis.  He is asymptomatic from a cardiac standpoint.  He has chronic toenail thickening bilaterally.  He has tried oral antifungals in the past but it did not help.  He has a history of incidental finding of thrombocytopenia a couple years ago.  Most recent value was improved and acceptable.  He has had no bleeding events.  He is up-to-date for eye exams and dental exams.  He has had colonoscopy which was normal.  Family history is reviewed in detail.                ROS: Denies any headaches, vision change, hearing loss.  Denies any abdominal issues, bowel or bladder dysfunction.  No musculoskeletal concerns.  All other systems were reviewed and are negative.  Objective: Vital Signs: BP 124/78   Pulse 85   Ht 5' 8.5" (1.74 m)   Wt 207 lb 6.4 oz (94.1 kg)   BMI 31.08 kg/m   Physical Exam:  General:  Alert and oriented, in no acute distress. Pulm:  Breathing unlabored. Psy:  Normal mood, congruent  affect. Skin: He has a papular lesion behind his left ear with a whitish appearance, slight vascularity over the surface.  No other worrisome lesions seen. HEENT:  [place]/AT, PERRLA, EOM Full, no nystagmus.  Funduscopic examination within normal limits.  No conjunctival erythema.  Tympanic membranes are pearly gray with normal landmarks.  External ear canals are normal.  Nasal passages are clear.  Oropharynx is clear.  No significant lymphadenopathy.  No thyromegaly or nodules.  2+ carotid pulses without bruits. CV: Regular rate and rhythm without murmurs, rubs, or gallops.  No peripheral edema.  2+ radial and posterior tibial pulses. Lungs: Clear to auscultation throughout with no wheezing or areas of consolidation. Abdomen: Protuberant, no hepatosplenomegaly.  Bowel sounds are active. Extremities: He has hypertrophic toenails on his feet with slight yellowish appearance.   Imaging: No results found.  Assessment & Plan: 1.  Wellness examination -Fasting labs in the near future. -Recommended some over-the-counter supplements for illness prevention purposes.  2.  Hyperlipidemia -Recheck in the near future.  We will try dietary changes first.  If he fails to improve with that, then CT calcium score.  If abnormal, then lipid lowering medication.  3.  Skin lesion behind left ear, etiology uncertain.  Could be a sebaceous cyst or possibly a small lipoma. -Dermatology consult.  4.  Possible onychomycosis -Vicks VapoRub twice daily for 3 to 6 months.  5.  History of thrombocytopenia -Recheck levels in the near future.  6.  Obesity -We will address this with lifestyle changes.     Procedures: No procedures performed  No notes on file     PMFS History: Patient Active Problem List   Diagnosis Date Noted  . Hyperlipidemia 09/07/2019  . Transaminitis 01/22/2018  . Alcohol use 01/22/2018  . Thrombocytopenia (HCC) 11/21/2017   Past Medical History:  Diagnosis Date  . Allergy      Family History  Problem Relation Age of Onset  . Emphysema Mother   . Dementia Father   . Healthy Sister   . Diabetes Brother   . Cancer Paternal Grandmother   . Leukemia Paternal Grandmother   . Colon cancer Neg Hx   . Prostate cancer Neg Hx     History reviewed. No pertinent surgical history. Social History   Occupational History  . Not on file  Tobacco Use  . Smoking status: Former Smoker    Packs/day: 1.00    Years: 39.00    Pack years: 39.00    Types: Cigarettes  . Smokeless tobacco: Never Used  Substance and Sexual Activity  . Alcohol use: Yes  . Drug use: No  . Sexual activity: Not on file

## 2019-09-10 ENCOUNTER — Ambulatory Visit (INDEPENDENT_AMBULATORY_CARE_PROVIDER_SITE_OTHER): Payer: 59

## 2019-09-10 ENCOUNTER — Other Ambulatory Visit: Payer: Self-pay

## 2019-09-10 DIAGNOSIS — E785 Hyperlipidemia, unspecified: Secondary | ICD-10-CM

## 2019-09-10 DIAGNOSIS — Z0000 Encounter for general adult medical examination without abnormal findings: Secondary | ICD-10-CM

## 2019-09-10 NOTE — Progress Notes (Signed)
Fasting labs drawn: hs-CRP, Vit.D, thyroid panel + TSH, PSA, lipid panel CMET & CBC/diff.

## 2019-09-11 ENCOUNTER — Telehealth: Payer: Self-pay | Admitting: Family Medicine

## 2019-09-11 LAB — COMPREHENSIVE METABOLIC PANEL
AG Ratio: 1.6 (calc) (ref 1.0–2.5)
ALT: 51 U/L — ABNORMAL HIGH (ref 9–46)
AST: 27 U/L (ref 10–35)
Albumin: 4.3 g/dL (ref 3.6–5.1)
Alkaline phosphatase (APISO): 67 U/L (ref 35–144)
BUN: 18 mg/dL (ref 7–25)
CO2: 25 mmol/L (ref 20–32)
Calcium: 9.5 mg/dL (ref 8.6–10.3)
Chloride: 102 mmol/L (ref 98–110)
Creat: 1.03 mg/dL (ref 0.70–1.33)
Globulin: 2.7 g/dL (calc) (ref 1.9–3.7)
Glucose, Bld: 92 mg/dL (ref 65–99)
Potassium: 4.6 mmol/L (ref 3.5–5.3)
Sodium: 137 mmol/L (ref 135–146)
Total Bilirubin: 0.7 mg/dL (ref 0.2–1.2)
Total Protein: 7 g/dL (ref 6.1–8.1)

## 2019-09-11 LAB — CBC WITH DIFFERENTIAL/PLATELET
Absolute Monocytes: 762 cells/uL (ref 200–950)
Basophils Absolute: 54 cells/uL (ref 0–200)
Basophils Relative: 0.7 %
Eosinophils Absolute: 362 cells/uL (ref 15–500)
Eosinophils Relative: 4.7 %
HCT: 48.7 % (ref 38.5–50.0)
Hemoglobin: 15.9 g/dL (ref 13.2–17.1)
Lymphs Abs: 1910 cells/uL (ref 850–3900)
MCH: 28.3 pg (ref 27.0–33.0)
MCHC: 32.6 g/dL (ref 32.0–36.0)
MCV: 86.8 fL (ref 80.0–100.0)
MPV: 10.2 fL (ref 7.5–12.5)
Monocytes Relative: 9.9 %
Neutro Abs: 4612 cells/uL (ref 1500–7800)
Neutrophils Relative %: 59.9 %
Platelets: 175 10*3/uL (ref 140–400)
RBC: 5.61 10*6/uL (ref 4.20–5.80)
RDW: 12.7 % (ref 11.0–15.0)
Total Lymphocyte: 24.8 %
WBC: 7.7 10*3/uL (ref 3.8–10.8)

## 2019-09-11 LAB — THYROID PANEL WITH TSH
Free Thyroxine Index: 2 (ref 1.4–3.8)
T3 Uptake: 28 % (ref 22–35)
T4, Total: 7.1 ug/dL (ref 4.9–10.5)
TSH: 2.01 mIU/L (ref 0.40–4.50)

## 2019-09-11 LAB — LIPID PANEL
Cholesterol: 257 mg/dL — ABNORMAL HIGH (ref <200)
HDL: 43 mg/dL (ref >=40)
LDL Cholesterol (Calc): 184 mg/dL (calc) — ABNORMAL HIGH
Non-HDL Cholesterol (Calc): 214 mg/dL (calc) — ABNORMAL HIGH (ref <130)
Total CHOL/HDL Ratio: 6 (calc) — ABNORMAL HIGH (ref <5.0)
Triglycerides: 156 mg/dL — ABNORMAL HIGH (ref <150)

## 2019-09-11 LAB — VITAMIN D 25 HYDROXY (VIT D DEFICIENCY, FRACTURES): Vit D, 25-Hydroxy: 26 ng/mL — ABNORMAL LOW (ref 30–100)

## 2019-09-11 LAB — PSA: PSA: 0.8 ng/mL (ref <=4.0)

## 2019-09-11 LAB — HIGH SENSITIVITY CRP: hs-CRP: 2.9 mg/L

## 2019-09-11 NOTE — Telephone Encounter (Signed)
Labs are notable for the following:  Vitamin D is low at 26.  We want this to be 50-80.  I recommend taking over-the-counter vitamin D3 at 5000 IU daily.  Recheck in about 6 months.  Lipid panel is notable for triglycerides of 156 and HDL of 43.  We want the HDL to be greater than 60, and the triglycerides to be no more than doubled the HDL.  Total and LDL cholesterol numbers are elevated as well.  It is important to exercise on a regular basis and to minimize dietary intake of processed carbohydrates including breads, pastas, cereals, sugars and sweets.  We should recheck this in about 6 months.  All else looked good.

## 2019-09-29 ENCOUNTER — Encounter: Payer: Self-pay | Admitting: Family Medicine

## 2019-09-29 ENCOUNTER — Other Ambulatory Visit: Payer: Self-pay | Admitting: Family Medicine

## 2019-09-29 MED ORDER — TIZANIDINE HCL 2 MG PO TABS: 2.0000 mg | ORAL_TABLET | Freq: Four times a day (QID) | ORAL | 1 refills | Status: AC | PRN

## 2019-10-28 IMAGING — US US ABDOMEN COMPLETE
1 series · 14 of 25 positions shown · non-contrast
Comparison: None.

CLINICAL DATA: Thrombocytopenia

EXAM:
ABDOMEN ULTRASOUND COMPLETE

[Series 1: us abdomen complete · 14 of 108 slices shown]
[im 1/108]
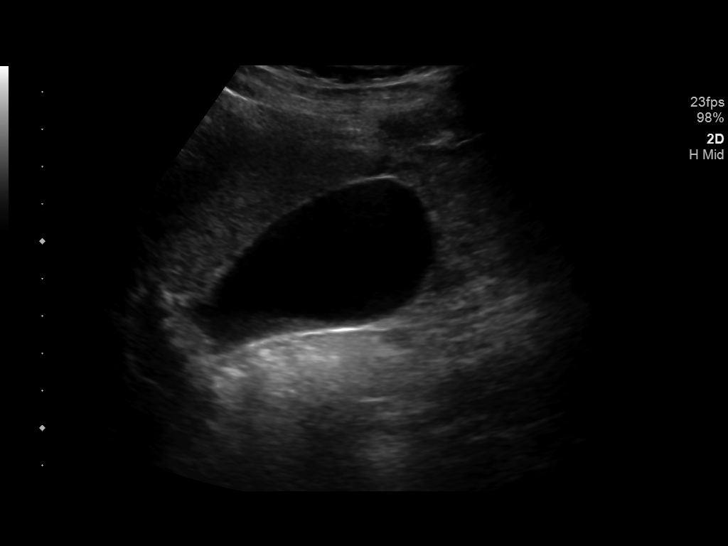
[im 9/108]
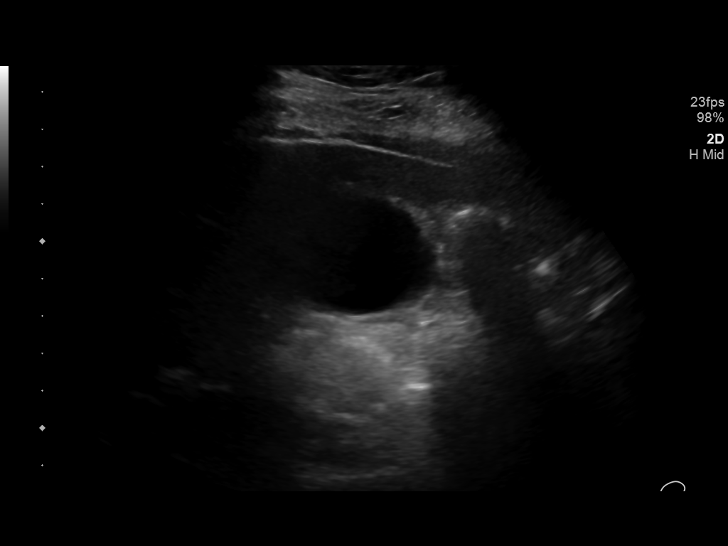
[im 18/108]
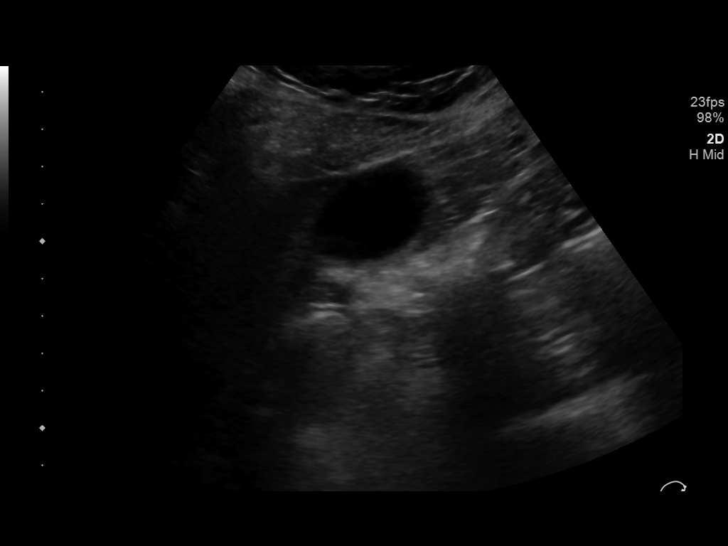
[im 27/108]
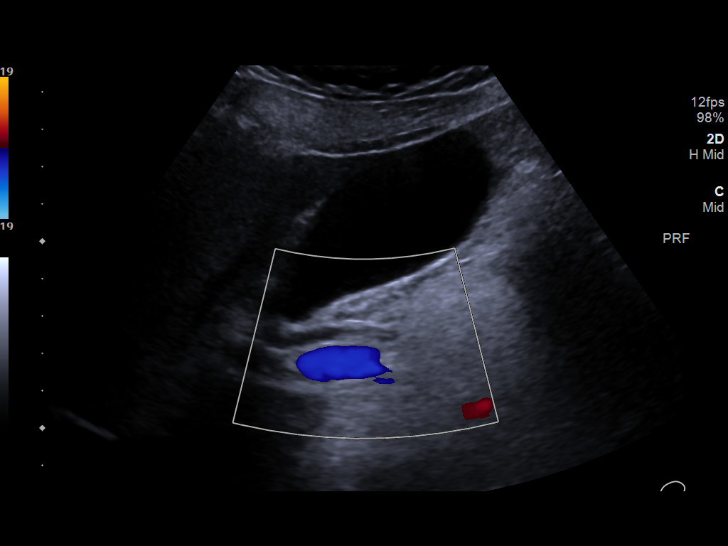
[im 36/108]
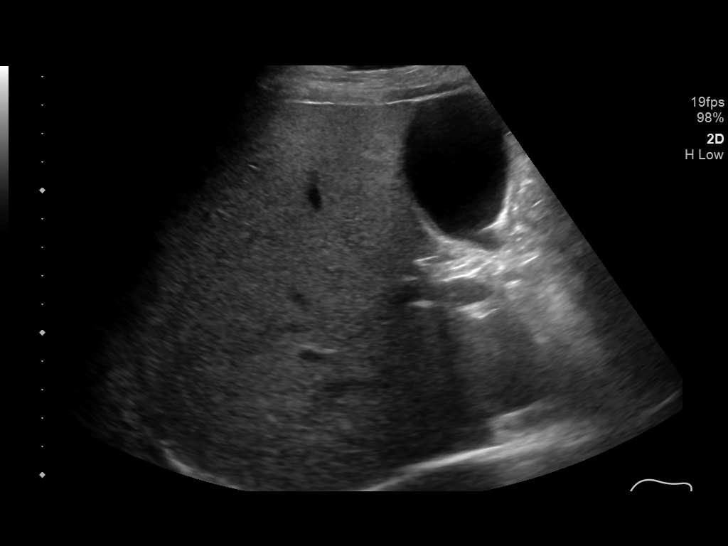
[im 41/108]
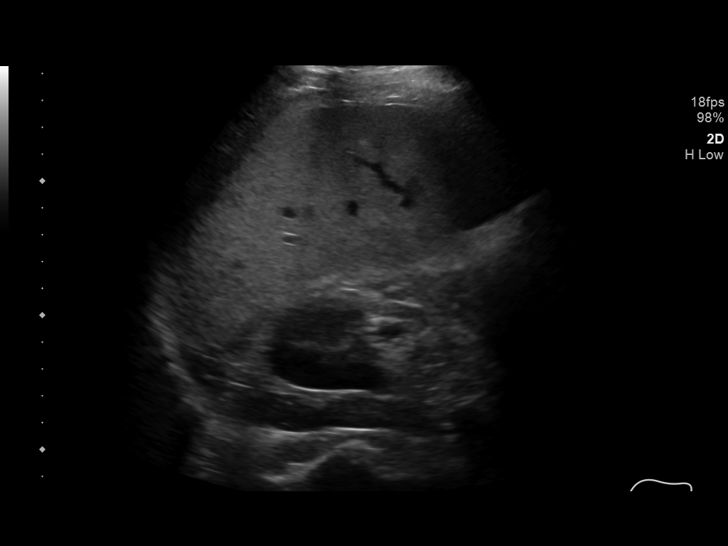
[im 50/108]
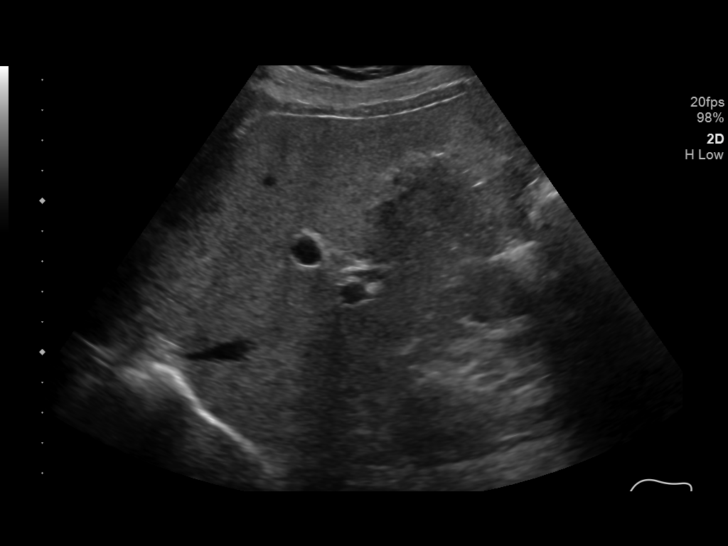
[im 58/108]
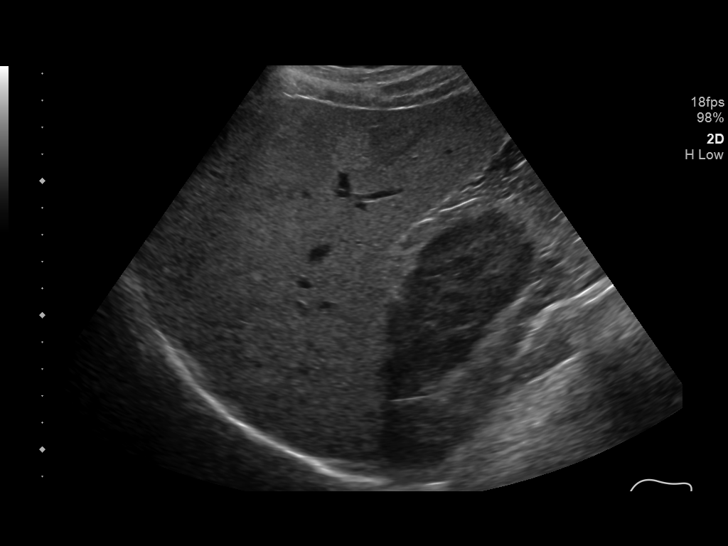
[im 67/108]
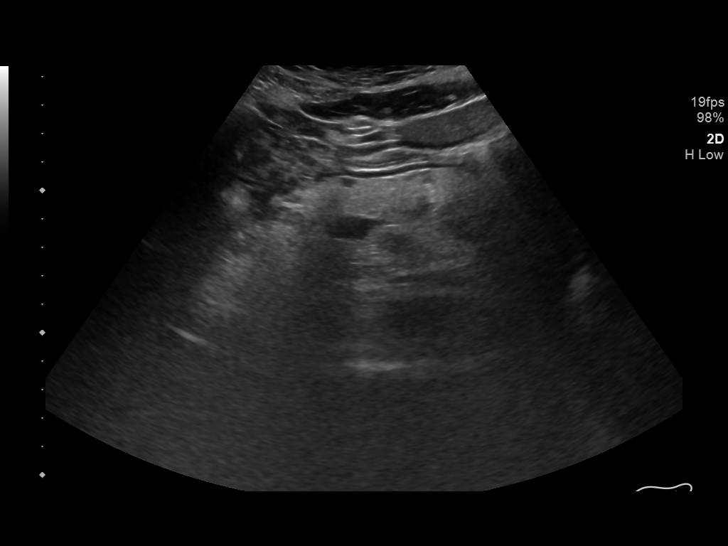
[im 72/108]
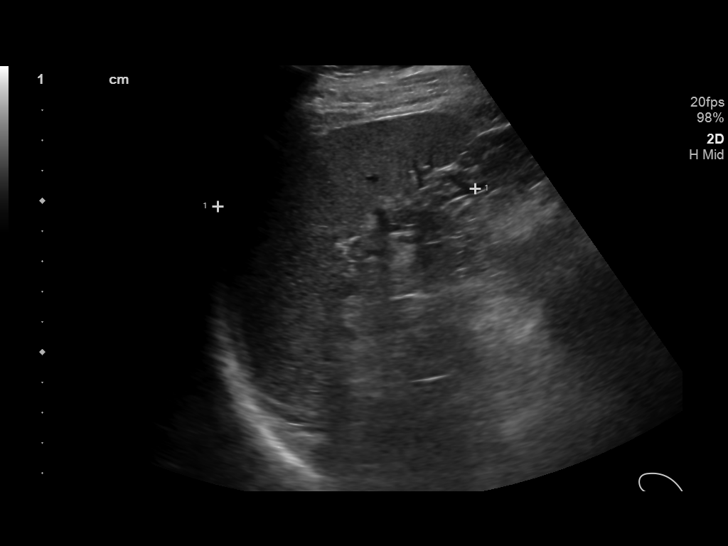
[im 81/108]
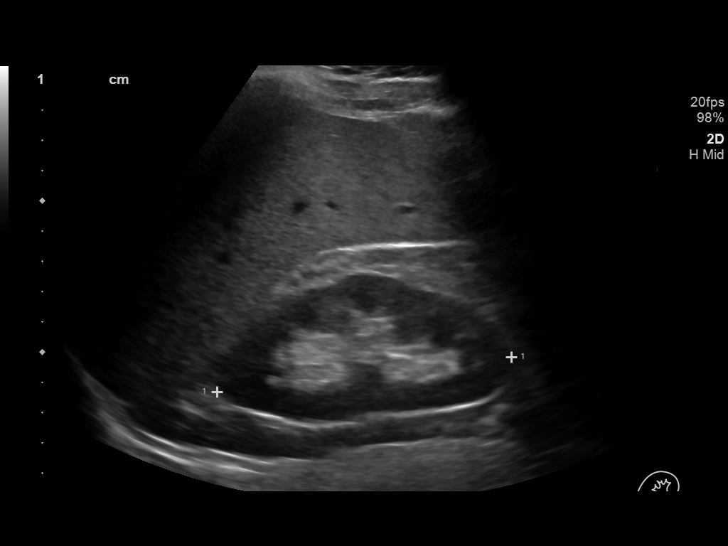
[im 90/108]
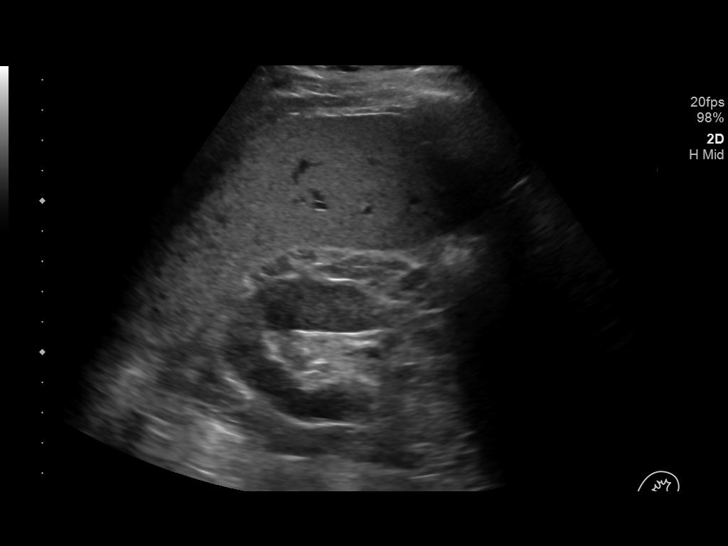
[im 99/108]
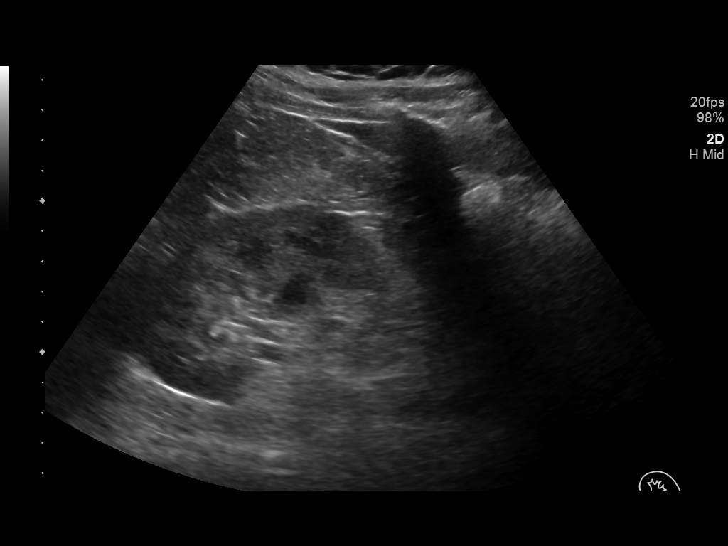
[im 108/108]
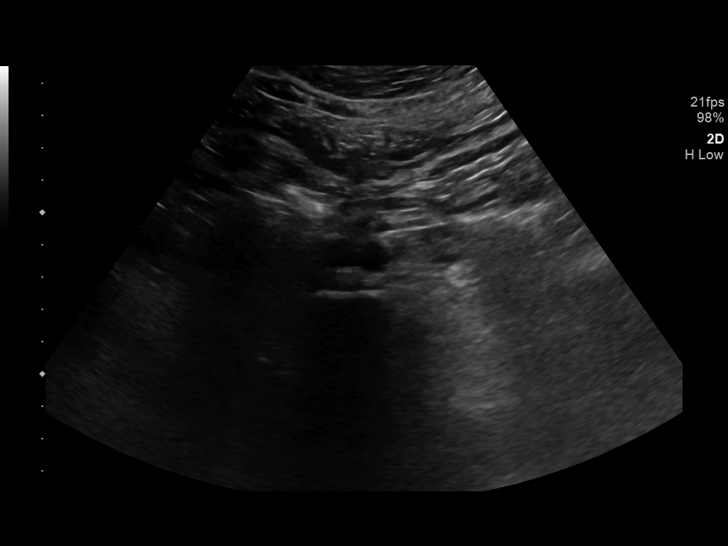

[14 of 25 positions shown; findings below may reference images not displayed]

FINDINGS: Gallbladder: No gallstones or wall thickening visualized. No
sonographic Murphy sign noted by sonographer.

Common bile duct: Diameter: 4 mm

Liver: Diffuse increased echogenicity is noted likely related to
fatty infiltration. No focal mass is noted. Portal vein is patent on
color Doppler imaging with normal direction of blood flow towards
the liver.

IVC: No abnormality visualized.

Pancreas: Visualized portion unremarkable.

Spleen: Size and appearance within normal limits.

Right Kidney: Length: 9.9 cm.. Echogenicity within normal limits. No
mass or hydronephrosis visualized.

Left Kidney: Length: 12.2 cm.. Echogenicity within normal limits. No
mass or hydronephrosis visualized.

Abdominal aorta: No aneurysm visualized.

Other findings: None.
IMPRESSION: No acute abnormality is noted.

Fatty infiltration of the liver.

## 2020-01-05 ENCOUNTER — Ambulatory Visit: Payer: Self-pay

## 2020-01-05 ENCOUNTER — Ambulatory Visit: Payer: 59

## 2020-01-05 ENCOUNTER — Encounter: Payer: Self-pay | Admitting: Orthopaedic Surgery

## 2020-01-05 ENCOUNTER — Ambulatory Visit: Payer: 59 | Admitting: Orthopaedic Surgery

## 2020-01-05 DIAGNOSIS — M5442 Lumbago with sciatica, left side: Secondary | ICD-10-CM | POA: Diagnosis not present

## 2020-01-05 DIAGNOSIS — M25562 Pain in left knee: Secondary | ICD-10-CM | POA: Diagnosis not present

## 2020-01-05 MED ORDER — PREDNISONE 10 MG (21) PO TBPK: ORAL_TABLET | ORAL | 0 refills | Status: DC

## 2020-01-05 NOTE — Progress Notes (Signed)
Office Visit Note   Patient: Brian Yu           Date of Birth: May 06, 1965           MRN: [id number] Visit Date: 01/05/2020              Requested by: Lavada Mesi, MD [address] [place],  [place] [address] PCP: Lavada Mesi, MD   Assessment & Plan: Visit Diagnoses:  1. Acute pain of left knee   2. Acute left-sided low back pain with left-sided sciatica     Plan: Impression is lumbar radiculopathy and sciatica.  Prednisone Dosepak prescribed today.  We gave him home exercises for the lumbar spine.  His knee complaints are likely either referred pain from the radiculopathy or compensation from the altered gait which I feel will resolve as the sciatica resolves.  Follow-Up Instructions: Return if symptoms worsen or fail to improve.   Orders:  Orders Placed This Encounter  Procedures  . XR Lumbar Spine 2-3 Views  . XR KNEE 3 VIEW LEFT   Meds ordered this encounter  Medications  . predniSONE (STERAPRED UNI-PAK 21 TAB) 10 MG (21) TBPK tablet    Sig: Take as directed    Dispense:  21 tablet    Refill:  0      Procedures: No procedures performed   Clinical Data: No additional findings.   Subjective: Chief Complaint  Patient presents with  . Lower Back - Pain  . Left Knee - Pain    Brian Yu is a 54 year old gentleman here for low back pain with left leg and left knee pain.  Pain started last week with radiation into the side of the buttock and down the leg but then improved but then it came back couple days ago.  He woke up with intense pain.  He denies any weakness.  He is increased pain with walking.  He has tried Advil, Celebrex, muscle relaxer, Tylenol for this and they have all been not all that helpful.  Denies any bowel or bladder dysfunction or constitutional symptoms.   Review of Systems  Constitutional: Negative.   All other systems reviewed and are negative.    Objective: Vital Signs: There were no vitals taken for this visit.  Physical  Exam Vitals and nursing note reviewed.  Constitutional:      Appearance: He is well-developed and well-nourished.  HENT:     Head: Normocephalic and atraumatic.  Eyes:     Pupils: Pupils are equal, round, and reactive to light.  Pulmonary:     Effort: Pulmonary effort is normal.  Abdominal:     Palpations: Abdomen is soft.  Musculoskeletal:        General: Normal range of motion.     Cervical back: Neck supple.  Skin:    General: Skin is warm.  Neurological:     Mental Status: He is alert and oriented to person, place, and time.  Psychiatric:        Mood and Affect: Mood and affect normal.        Behavior: Behavior normal.        Thought Content: Thought content normal.        Judgment: Judgment normal.     Ortho Exam Lumbar spine shows normal range of motion.  Negative sciatic tension signs.  Normal motor and sensory function of the lower extremities.  Left knee exam is unremarkable.  Hip exam is unremarkable. Specialty Comments:  No specialty comments available.  Imaging: XR  KNEE 3 VIEW LEFT  Result Date: 01/05/2020 No acute or structural abnormalities  XR Lumbar Spine 2-3 Views  Result Date: 01/05/2020 Normal lumbar lordosis.  Mild spondylosis.    PMFS History: Patient Active Problem List   Diagnosis Date Noted  . Hyperlipidemia 09/07/2019  . Transaminitis 01/22/2018  . Alcohol use 01/22/2018  . Thrombocytopenia (HCC) 11/21/2017   Past Medical History:  Diagnosis Date  . Allergy     Family History  Problem Relation Age of Onset  . Emphysema Mother   . Dementia Father   . Healthy Sister   . Diabetes Brother   . Cancer Paternal Grandmother   . Leukemia Paternal Grandmother   . Colon cancer Neg Hx   . Prostate cancer Neg Hx     History reviewed. No pertinent surgical history. Social History   Occupational History  . Not on file  Tobacco Use  . Smoking status: Former Smoker    Packs/day: 1.00    Years: 39.00    Pack years: 39.00    Types:  Cigarettes  . Smokeless tobacco: Never Used  Substance and Sexual Activity  . Alcohol use: Yes  . Drug use: No  . Sexual activity: Not on file

## 2020-03-08 ENCOUNTER — Encounter: Payer: Self-pay | Admitting: Orthopaedic Surgery

## 2020-04-11 ENCOUNTER — Other Ambulatory Visit: Payer: Self-pay | Admitting: Family Medicine

## 2020-04-11 MED ORDER — SUMATRIPTAN SUCCINATE 100 MG PO TABS: 100.0000 mg | ORAL_TABLET | ORAL | 6 refills | Status: AC | PRN

## 2020-07-29 ENCOUNTER — Other Ambulatory Visit: Payer: Self-pay | Admitting: Family Medicine

## 2020-07-29 MED ORDER — SULFAMETHOXAZOLE-TRIMETHOPRIM 800-160 MG PO TABS: 1.0000 | ORAL_TABLET | Freq: Two times a day (BID) | ORAL | 0 refills | Status: DC

## 2022-05-16 ENCOUNTER — Telehealth: Payer: Self-pay | Admitting: Gastroenterology

## 2022-05-16 NOTE — Telephone Encounter (Signed)
Patient requesting transfer to Dr Rhea Belton from Dr Kinnie Scales office.  Advised to have recs faxed for review.  Need to have colon procedure.

## 2022-08-15 ENCOUNTER — Other Ambulatory Visit: Payer: Self-pay | Admitting: Family Medicine

## 2022-08-15 DIAGNOSIS — Z8639 Personal history of other endocrine, nutritional and metabolic disease: Secondary | ICD-10-CM

## 2022-09-03 ENCOUNTER — Ambulatory Visit
Admission: RE | Admit: 2022-09-03 | Discharge: 2022-09-03 | Disposition: A | Discharge status: Home / Self Care | Payer: No Typology Code available for payment source | Source: Ambulatory Visit | Attending: Family Medicine | Admitting: Family Medicine

## 2022-09-03 DIAGNOSIS — Z8639 Personal history of other endocrine, nutritional and metabolic disease: Secondary | ICD-10-CM

## 2022-09-20 ENCOUNTER — Telehealth: Payer: Self-pay

## 2022-09-20 NOTE — Telephone Encounter (Signed)
Spoke with patient and he scheduled his colonoscopy with Dr. Rhea Belton on 12/10/22 at 8:30am and telephone nurse visit on 11/27/22 at 4:00pm.

## 2022-09-20 NOTE — Telephone Encounter (Signed)
-----   Message from Carie Caddy Pyrtle sent at 09/19/2022 12:00 PM EDT ----- Please schedule patient for surveillance colonoscopy in the [facility] through previsit We do not need to wait on [facility] records History of colon polyps at colonoscopy 5 years ago

## 2022-11-27 ENCOUNTER — Ambulatory Visit (AMBULATORY_SURGERY_CENTER): Payer: 59

## 2022-11-27 ENCOUNTER — Encounter: Payer: Self-pay | Admitting: Internal Medicine

## 2022-11-27 VITALS — Ht 70.0 in | Wt 195.0 lb

## 2022-11-27 DIAGNOSIS — Z860100 Personal history of colon polyps, unspecified: Secondary | ICD-10-CM

## 2022-11-27 MED ORDER — NA SULFATE-K SULFATE-MG SULF 17.5-3.13-1.6 GM/177ML PO SOLN: 1.0000 | Freq: Once | ORAL | 0 refills | Status: AC

## 2022-11-27 NOTE — Progress Notes (Signed)

## 2022-12-10 ENCOUNTER — Encounter: Payer: Self-pay | Admitting: Internal Medicine

## 2022-12-10 ENCOUNTER — Ambulatory Visit: Payer: 59 | Admitting: Internal Medicine

## 2022-12-10 VITALS — BP 128/68 | HR 54 | Temp 98.3°F | Resp 13 | Ht 70.0 in | Wt 195.0 lb

## 2022-12-10 DIAGNOSIS — Z1211 Encounter for screening for malignant neoplasm of colon: Secondary | ICD-10-CM

## 2022-12-10 DIAGNOSIS — D122 Benign neoplasm of ascending colon: Secondary | ICD-10-CM

## 2022-12-10 DIAGNOSIS — Z860100 Personal history of colon polyps, unspecified: Secondary | ICD-10-CM

## 2022-12-10 MED ORDER — SODIUM CHLORIDE 0.9 % IV SOLN: 500.0000 mL | Freq: Once | INTRAVENOUS | Status: DC

## 2022-12-10 NOTE — Progress Notes (Signed)
Pt's states no medical or surgical changes since previsit or office visit. 

## 2022-12-10 NOTE — Progress Notes (Signed)
GASTROENTEROLOGY PROCEDURE H&P NOTE   Primary Care Physician: Lavada Mesi, MD    Reason for Procedure:  History of colon polyps  Plan:    Colonoscopy  Patient is appropriate for endoscopic procedure(s) in the ambulatory ([facility]) setting.  The nature of the procedure, as well as the risks, benefits, and alternatives were carefully and thoroughly reviewed with the patient. Ample time for discussion and questions allowed. The patient understood, was satisfied, and agreed to proceed.     HPI: Brian Yu is a 57 y.o. male who presents for surveillance colonoscopy.  Medical history as below.  Tolerated the prep.  No recent chest pain or shortness of breath.  No abdominal pain today.  Past Medical History:  Diagnosis Date   Allergy     Past Surgical History:  Procedure Laterality Date   COLONOSCOPY  2018    Prior to Admission medications   Medication Sig Start Date End Date Taking? Authorizing Provider  SUMAtriptan (IMITREX) 100 MG tablet Take 1 tablet (100 mg total) by mouth every 2 (two) hours as needed for migraine. May repeat in 2 hours if headache persists or recurs. 04/11/20  Yes Hilts, Casimiro Needle, MD  ibuprofen (ADVIL,MOTRIN) 200 MG tablet Take 600 mg by mouth every 6 (six) hours as needed.    [provider]  tiZANidine (ZANAFLEX) 2 MG tablet Take 1-2 tablets (2-4 mg total) by mouth every 6 (six) hours as needed for muscle spasms. 09/29/19   Hilts, Casimiro Needle, MD    Current Outpatient Medications  Medication Sig Dispense Refill   SUMAtriptan (IMITREX) 100 MG tablet Take 1 tablet (100 mg total) by mouth every 2 (two) hours as needed for migraine. May repeat in 2 hours if headache persists or recurs. 10 tablet 6   ibuprofen (ADVIL,MOTRIN) 200 MG tablet Take 600 mg by mouth every 6 (six) hours as needed.     tiZANidine (ZANAFLEX) 2 MG tablet Take 1-2 tablets (2-4 mg total) by mouth every 6 (six) hours as needed for muscle spasms. 60 tablet 1   Current  Facility-Administered Medications  Medication Dose Route Frequency Provider Last Rate Last Admin   0.9 %  sodium chloride infusion  500 mL Intravenous Once Riggs Dineen, Carie Caddy, MD        Allergies as of 12/10/2022   (No Known Allergies)    Family History  Problem Relation Age of Onset   Emphysema Mother    Dementia Father    Healthy Sister    Diabetes Brother    Cancer Paternal Grandmother    Leukemia Paternal Grandmother    Colon cancer Neg Hx    Prostate cancer Neg Hx    Colon polyps Neg Hx    Esophageal cancer Neg Hx    Rectal cancer Neg Hx    Stomach cancer Neg Hx     Social History   Socioeconomic History   Marital status: Married    Spouse name: Not on file   Number of children: Not on file   Years of education: Not on file   Highest education level: Not on file  Occupational History   Not on file  Tobacco Use   Smoking status: Former    Current packs/day: 1.00    Average packs/day: 1 pack/day for 39.0 years (39.0 ttl pk-yrs)    Types: Cigarettes   Smokeless tobacco: Never  Vaping Use   Vaping status: Never Used  Substance and Sexual Activity   Alcohol use: Yes   Drug use: No  Sexual activity: Yes  Other Topics Concern   Not on file  Social History Narrative   ** Merged History Encounter **       Social Determinants of Health   Financial Resource Strain: Not on file  Food Insecurity: Not on file  Transportation Needs: Not on file  Physical Activity: Not on file  Stress: Not on file  Social Connections: Not on file  Intimate Partner Violence: Not on file    Physical Exam: Vital signs in last 24 hours: @BP  122/68   Pulse 60   Temp 98.3 F (36.8 C) (Temporal)   Ht 5\' 10"  (1.778 m)   Wt 195 lb (88.5 kg)   SpO2 98%   BMI 27.98 kg/m  GEN: NAD EYE: Sclerae anicteric ENT: MMM CV: Non-tachycardic Pulm: CTA b/l GI: Soft, NT/ND NEURO:  Alert & Oriented x 3   Brian Blinks, MD [facility]  12/10/2022 8:32 AM

## 2022-12-10 NOTE — Patient Instructions (Signed)
Thank you for letting us take care of your healthcare needs today! Please see handout regarding polyps.  YOU HAD AN ENDOSCOPIC PROCEDURE TODAY AT THE  ENDOSCOPY CENTER:   Refer to the procedure report that was given to you for any specific questions about what was found during the examination.  If the procedure report does not answer your questions, please call your gastroenterologist to clarify.  If you requested that your care partner not be given the details of your procedure findings, then the procedure report has been included in a sealed envelope for you to review at your convenience later.  YOU SHOULD EXPECT: Some feelings of bloating in the abdomen. Passage of more gas than usual.  Walking can help get rid of the air that was put into your GI tract during the procedure and reduce the bloating. If you had a lower endoscopy (such as a colonoscopy or flexible sigmoidoscopy) you may notice spotting of blood in your stool or on the toilet paper. If you underwent a bowel prep for your procedure, you may not have a normal bowel movement for a few days.  Please Note:  You might notice some irritation and congestion in your nose or some drainage.  This is from the oxygen used during your procedure.  There is no need for concern and it should clear up in a day or so.  SYMPTOMS TO REPORT IMMEDIATELY:  Following lower endoscopy (colonoscopy or flexible sigmoidoscopy):  Excessive amounts of blood in the stool  Significant tenderness or worsening of abdominal pains  Swelling of the abdomen that is new, acute  Fever of 100F or higher  For urgent or emergent issues, a gastroenterologist can be reached at any hour by calling [phone]. Do not use MyChart messaging for urgent concerns.    DIET:  We do recommend a small meal at first, but then you may proceed to your regular diet.  Drink plenty of fluids but you should avoid alcoholic beverages for 24 hours.  ACTIVITY:  You should plan to  take it easy for the rest of today and you should NOT DRIVE or use heavy machinery until tomorrow (because of the sedation medicines used during the test).    FOLLOW UP: Our staff will call the number listed on your records the next business day following your procedure.  We will call around 7:15- 8:00 am to check on you and address any questions or concerns that you may have regarding the information given to you following your procedure. If we do not reach you, we will leave a message.     If any biopsies were taken you will be contacted by phone or by letter within the next 1-3 weeks.  Please call us at [phone] if you have not heard about the biopsies in 3 weeks.    SIGNATURES/CONFIDENTIALITY: You and/or your care partner have signed paperwork which will be entered into your electronic medical record.  These signatures attest to the fact that that the information above on your After Visit Summary has been reviewed and is understood.  Full responsibility of the confidentiality of this discharge information lies with you and/or your care-partner.

## 2022-12-10 NOTE — Progress Notes (Signed)
Vss nad trans to pacu 

## 2022-12-10 NOTE — Progress Notes (Signed)
Called to room to assist during endoscopic procedure.  Patient ID and intended procedure confirmed with present staff. Received instructions for my participation in the procedure from the performing physician.  

## 2022-12-10 NOTE — Op Note (Signed)
[facility] Patient Name: Brian Yu Procedure Date: 12/10/2022 8:34 AM MRN: [id number] Endoscopist: Beverley Fiedler , MD, [id number] Age: 57 Referring MD:  Date of Birth: 31-Oct-1965 Gender: Male Account #: [id number] Procedure:                Colonoscopy Indications:              High risk colon cancer surveillance: Personal                            history of colonic polyps, Last colonoscopy 5 years                            ago Medicines:                Monitored Anesthesia Care Procedure:                Pre-Anesthesia Assessment:                           - Prior to the procedure, a History and Physical                            was performed, and patient medications and                            allergies were reviewed. The patient's tolerance of                            previous anesthesia was also reviewed. The risks                            and benefits of the procedure and the sedation                            options and risks were discussed with the patient.                            All questions were answered, and informed consent                            was obtained. Prior Anticoagulants: The patient has                            taken no anticoagulant or antiplatelet agents. ASA                            Grade Assessment: II - A patient with mild systemic                            disease. After reviewing the risks and benefits,                            the patient was deemed in satisfactory condition to  undergo the procedure.                           After obtaining informed consent, the colonoscope                            was passed under direct vision. Throughout the                            procedure, the patient's blood pressure, pulse, and                            oxygen saturations were monitored continuously. The                            CF [id number] #[id number] was introduced through the anus                             and advanced to the cecum, identified by                            appendiceal orifice and ileocecal valve. The                            colonoscopy was performed without difficulty. The                            patient tolerated the procedure well. The quality                            of the bowel preparation was excellent. The                            ileocecal valve, appendiceal orifice, and rectum                            were photographed. Scope In: 8:38:43 AM Scope Out: 8:49:22 AM Scope Withdrawal Time: 0 hours 7 minutes 39 seconds  Total Procedure Duration: 0 hours 10 minutes 39 seconds  Findings:                 The digital rectal exam was normal.                           A 4 mm polyp was found in the ascending colon. The                            polyp was sessile. The polyp was removed with a                            cold snare. Resection and retrieval were complete.                           The exam was otherwise without abnormality on  direct and retroflexion views. Complications:            No immediate complications. Estimated Blood Loss:     Estimated blood loss: none. Impression:               - One 4 mm polyp in the ascending colon, removed                            with a cold snare. Resected and retrieved.                           - The examination was otherwise normal on direct                            and retroflexion views. Recommendation:           - Patient has a contact number available for                            emergencies. The signs and symptoms of potential                            delayed complications were discussed with the                            patient. Return to normal activities tomorrow.                            Written discharge instructions were provided to the                            patient.                           - Resume previous diet.                           -  Continue present medications.                           - Await pathology results.                           - Repeat colonoscopy date to be determined after                            pending pathology results are reviewed for                            surveillance. Beverley Fiedler, MD 12/10/2022 8:51:42 AM This report has been signed electronically.

## 2022-12-11 ENCOUNTER — Telehealth: Payer: Self-pay

## 2022-12-11 NOTE — Telephone Encounter (Signed)
  Follow up Call-     12/10/2022    7:55 AM  Call back number  Post procedure Call Back phone  # [phone]  Permission to leave phone message Yes     Patient questions:  Do you have a fever, pain , or abdominal swelling? No. Pain Score  0 *  Have you tolerated food without any problems? Yes.    Have you been able to return to your normal activities? Yes.    Do you have any questions about your discharge instructions: Diet   No. Medications  No. Follow up visit  No.  Do you have questions or concerns about your Care? No.  Actions: * If pain score is 4 or above: No action needed, pain <4.

## 2022-12-12 LAB — SURGICAL PATHOLOGY

## 2022-12-14 ENCOUNTER — Encounter: Payer: Self-pay | Admitting: Internal Medicine
